# Patient Record
Sex: Female | Born: 1965
Health system: Southern US, Community
[De-identification: ages and names within clinical notes are randomized; demographics above are authoritative.]

## PROBLEM LIST (undated history)

## (undated) DIAGNOSIS — M5030 Other cervical disc degeneration, unspecified cervical region: Secondary | ICD-10-CM

## (undated) DIAGNOSIS — E785 Hyperlipidemia, unspecified: Secondary | ICD-10-CM

## (undated) DIAGNOSIS — I059 Rheumatic mitral valve disease, unspecified: Secondary | ICD-10-CM

## (undated) DIAGNOSIS — I493 Ventricular premature depolarization: Secondary | ICD-10-CM

## (undated) DIAGNOSIS — N200 Calculus of kidney: Secondary | ICD-10-CM

## (undated) DIAGNOSIS — J189 Pneumonia, unspecified organism: Secondary | ICD-10-CM

## (undated) DIAGNOSIS — K8020 Calculus of gallbladder without cholecystitis without obstruction: Secondary | ICD-10-CM

## (undated) DIAGNOSIS — Z87442 Personal history of urinary calculi: Secondary | ICD-10-CM

## (undated) HISTORY — PX: TONSILLECTOMY: SHX5217

## (undated) HISTORY — PX: TONSILLECTOMY: SUR1361

## (undated) HISTORY — PX: CHOLECYSTECTOMY: SHX55

## (undated) HISTORY — DX: Hyperlipidemia, unspecified: E78.5

## (undated) HISTORY — DX: Rheumatic mitral valve disease, unspecified: I05.9

## (undated) HISTORY — DX: Calculus of gallbladder without cholecystitis without obstruction: K80.20

## (undated) HISTORY — DX: Personal history of urinary calculi: Z87.442

## (undated) HISTORY — DX: Pneumonia, unspecified organism: J18.9

## (undated) HISTORY — PX: ABDOMINAL HYSTERECTOMY: SHX81

## (undated) HISTORY — DX: Other cervical disc degeneration, unspecified cervical region: M50.30

## (undated) HISTORY — DX: Calculus of kidney: N20.0

---

## 2008-08-24 ENCOUNTER — Ambulatory Visit: Payer: Self-pay | Admitting: Internal Medicine

## 2008-08-24 DIAGNOSIS — Z87442 Personal history of urinary calculi: Secondary | ICD-10-CM | POA: Insufficient documentation

## 2008-08-24 DIAGNOSIS — M5030 Other cervical disc degeneration, unspecified cervical region: Secondary | ICD-10-CM | POA: Insufficient documentation

## 2008-08-24 DIAGNOSIS — L570 Actinic keratosis: Secondary | ICD-10-CM | POA: Insufficient documentation

## 2008-08-24 DIAGNOSIS — I059 Rheumatic mitral valve disease, unspecified: Secondary | ICD-10-CM | POA: Insufficient documentation

## 2008-08-25 LAB — CONVERTED CEMR LAB
Cholesterol: 227 mg/dL
Direct LDL: 141.4 mg/dL
Glucose, Bld: 93 mg/dL
HDL: 71.5 mg/dL
Total CHOL/HDL Ratio: 3.2
Triglycerides: 49 mg/dL
VLDL: 10 mg/dL

## 2008-10-28 ENCOUNTER — Emergency Department (HOSPITAL_COMMUNITY): Admission: EM | Admit: 2008-10-28 | Discharge: 2008-10-28 | Payer: Self-pay | Admitting: Emergency Medicine

## 2009-10-04 ENCOUNTER — Encounter: Payer: Self-pay | Admitting: Internal Medicine

## 2009-10-30 ENCOUNTER — Encounter: Payer: Self-pay | Admitting: Internal Medicine

## 2009-10-31 ENCOUNTER — Encounter: Payer: Self-pay | Admitting: Internal Medicine

## 2009-11-01 DIAGNOSIS — R928 Other abnormal and inconclusive findings on diagnostic imaging of breast: Secondary | ICD-10-CM | POA: Insufficient documentation

## 2009-11-06 ENCOUNTER — Encounter (INDEPENDENT_AMBULATORY_CARE_PROVIDER_SITE_OTHER): Payer: Self-pay | Admitting: *Deleted

## 2009-12-07 ENCOUNTER — Ambulatory Visit: Payer: Self-pay | Admitting: Family Medicine

## 2009-12-08 ENCOUNTER — Encounter: Payer: Self-pay | Admitting: Family Medicine

## 2010-01-24 ENCOUNTER — Telehealth: Payer: Self-pay | Admitting: Internal Medicine

## 2010-03-14 ENCOUNTER — Telehealth: Payer: Self-pay | Admitting: Internal Medicine

## 2010-05-07 ENCOUNTER — Ambulatory Visit: Payer: Self-pay | Admitting: Internal Medicine

## 2010-05-07 DIAGNOSIS — E785 Hyperlipidemia, unspecified: Secondary | ICD-10-CM | POA: Insufficient documentation

## 2010-05-09 LAB — CONVERTED CEMR LAB: Glucose, Bld: 73 mg/dL (ref 70–99)

## 2010-05-25 ENCOUNTER — Ambulatory Visit: Payer: Self-pay | Admitting: Internal Medicine

## 2010-05-25 DIAGNOSIS — N390 Urinary tract infection, site not specified: Secondary | ICD-10-CM | POA: Insufficient documentation

## 2010-05-25 LAB — CONVERTED CEMR LAB
Nitrite: POSITIVE
Specific Gravity, Urine: 1.015
Urobilinogen, UA: 0.2

## 2010-05-26 ENCOUNTER — Encounter: Payer: Self-pay | Admitting: Family Medicine

## 2010-08-14 ENCOUNTER — Encounter: Payer: Self-pay | Admitting: Internal Medicine

## 2010-08-15 ENCOUNTER — Encounter: Payer: Self-pay | Admitting: Internal Medicine

## 2010-08-27 LAB — HM MAMMOGRAPHY: HM Mammogram: NORMAL

## 2010-08-28 ENCOUNTER — Encounter: Payer: Self-pay | Admitting: Internal Medicine

## 2010-08-28 ENCOUNTER — Ambulatory Visit
Admission: RE | Admit: 2010-08-28 | Discharge: 2010-08-28 | Payer: Self-pay | Source: Home / Self Care | Attending: Family Medicine | Admitting: Family Medicine

## 2010-08-28 LAB — CONVERTED CEMR LAB
Casts: 0 /lpf
Glucose, Urine, Semiquant: NEGATIVE
Nitrite: POSITIVE
Yeast, UA: 0

## 2010-08-29 ENCOUNTER — Encounter: Payer: Self-pay | Admitting: Family Medicine

## 2010-09-04 NOTE — Progress Notes (Signed)
Summary: refill request for estradiol  Phone Note Refill Request Message from:  Fax from Pharmacy  Refills Requested: Medication #1:  ESTRADIOL 0.025 MG/24HR PTWK apply one patch every week   Last Refilled: 06/27/2009 Faxed request from [employer] is on your desk.  Pt has only been seen here once in the last year, and that was for UTI.  Initial call taken by: Lowella Petties CMA,  January 24, 2010 11:15 AM  Follow-up for Phone Call        okay to fill 1 month with 3 refills have her set up a physical in the next few months Follow-up by: Cindee Salt MD,  January 24, 2010 1:44 PM  Additional Follow-up for Phone Call Additional follow up Details #1::        Rx faxed to pharmacy, Spoke with patient and advised results.  Additional Follow-up by: Mervin Hack CMA Duncan Dull),  January 24, 2010 2:42 PM    Prescriptions: ESTRADIOL 0.025 MG/24HR PTWK (ESTRADIOL) apply one patch every week  #12 Transderm x 0   Entered by:   Mervin Hack CMA (AAMA)   Authorized by:   Cindee Salt MD   Signed by:   Mervin Hack CMA (AAMA) on 01/24/2010   Method used:   [facility]        [id number]       [address]       [place], [place]  [address]       Ph: [phone]       Fax: [phone]   RxID:   [id number]

## 2010-09-04 NOTE — Assessment & Plan Note (Signed)
Summary: 9:15 ?UTI/CLE   Vital Signs:  Patient profile:   45 year old female Height:      64 inches Weight:      149.8 pounds BMI:     25.81 Temp:     98.2 degrees F oral Pulse rate:   76 / minute Pulse rhythm:   regular BP sitting:   120 / 70  (left arm) Cuff size:   regular  Vitals Entered By: Benny Lennert CMA Duncan Dull) (Dec 07, 2009 9:20 AM)  History of Present Illness: Chief complaint ? UTI  45 year old female with a history of UTI and pyelo presents with R flank pain.   Urine feels "like battery acid." No fever, chills, sweats. Has tried AZO and a lot of fluid No nausea no abd pain  ROS: resolving upper respiratory symptoms, no cough. no diarrhea  Allergies (verified): No Known Drug Allergies  Past History:  Past medical, surgical, family and social histories (including risk factors) reviewed, and no changes noted (except as noted below).  Past Medical History: Reviewed history from 08/24/2008 and no changes required. Cervial disk disease Mitral valve prolapse Nephrolithiasis, hx of  Past Surgical History: Reviewed history from 08/24/2008 and no changes required. Hysterectomy/oophorectomy Laparascopy for adhesions--other ovary removed Cholecystectomy Tonsillectomy  Family History: Reviewed history from 08/24/2008 and no changes required. Mom had breast cancer Dad had bipolar with psychosis. Died @71  1 brother CAD on Dad's side (paternal uncles) No DM ??HTN Mat GM had colon cancer  Social History: Reviewed history from 08/24/2008 and no changes required. 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 Neck supple. No masses, No LAD. Ears and Nose: No external deformity. ABD: S, NT, ND, +BS. No rebound tenderness. No HSM. + R CVAT. none on left. EXTR: No c/c/e NEURO:  Normal gait.  PSYCH: Normally interactive. Conversant. Not depressed or anxious appearing.  Calm demeanor.     Impression & Recommendations:  Problem # 1:  PYELONEPHRITIS (ICD-590.80) Pyelo - check cx  Her updated medication list for this problem includes:    Ciprofloxacin Hcl 500 Mg Tabs (Ciprofloxacin hcl) .Marland Kitchen... 1 by mouth two times a day  Orders: Venipuncture (16109) T-Culture, Urine ([address]11) UA Dipstick w/o Micro (manual) (91478)  Encouraged to push clear liquids, get enough rest, and take acetaminophen as needed. To be seen in 10 days if no improvement, sooner if worse.  Complete Medication List: 1)  Estradiol 0.025 Mg/24hr Ptwk (Estradiol) .... Apply one patch every week 2)  Tramadol Hcl 50 Mg Tabs (Tramadol hcl) .... Take 1 tablet by mouth once daily 3)  Ciprofloxacin Hcl 500 Mg Tabs (Ciprofloxacin hcl) .Marland Kitchen.. 1 by mouth two times a day Prescriptions: CIPROFLOXACIN HCL 500 MG TABS (CIPROFLOXACIN HCL) 1 by mouth two times a day  #28 x 0   Entered and Authorized by:   Hannah Beat MD   Signed by:   Hannah Beat MD on 12/07/2009   Method used:   Electronically to        [id number]       [address]       [place], [place]  [address]       Ph: [phone]       Fax: [phone]   RxID:   [id number]   Current Allergies (reviewed today): No known allergies   Appended  Document: 9:15 ?UTI/CLE    Clinical Lists Changes  Observations: Added new observation of PH URINE: 8.5  (12/07/2009 9:45) Added new observation of SPEC GR URIN: 1.010  (12/07/2009 9:45) Added new observation of APPEARANCE U: Cloudy  (12/07/2009 9:45) Added new observation of UA COLOR: yellow  (12/07/2009 9:45) Added new observation of WBC DIPSTK U: trace  (12/07/2009 9:45) Added new observation of NITRITE URN: positive  (12/07/2009 9:45) Added new observation of UROBILINOGEN: 0.2  (12/07/2009 9:45) Added new observation of PROTEIN, URN: trace  (12/07/2009  9:45) Added new observation of BLOOD UR DIP: negative  (12/07/2009 9:45) Added new observation of KETONES URN: negative  (12/07/2009 9:45) Added new observation of BILIRUBIN UR: negative  (12/07/2009 9:45) Added new observation of GLUCOSE, URN: negative  (12/07/2009 9:45)      Laboratory Results   Urine Tests  Date/Time Received: Dec 07, 2009 9:45 AM  Date/Time Reported: Dec 07, 2009 9:45 AM   Routine Urinalysis   Color: yellow Appearance: Cloudy Glucose: negative   (Normal Range: Negative) Bilirubin: negative   (Normal Range: Negative) Ketone: negative   (Normal Range: Negative) Spec. Gravity: 1.010   (Normal Range: 1.003-1.035) Blood: negative   (Normal Range: Negative) pH: 8.5   (Normal Range: 5.0-8.0) Protein: trace   (Normal Range: Negative) Urobilinogen: 0.2   (Normal Range: 0-1) Nitrite: positive   (Normal Range: Negative) Leukocyte Esterace: trace   (Normal Range: Negative)

## 2010-09-04 NOTE — Assessment & Plan Note (Signed)
Summary: ?UTI/CLE   Vital Signs:  Patient profile:   45 year old female Menstrual status:  hysterectomy Weight:      155.50 pounds Temp:     98.5 degrees F oral Pulse rate:   82 / minute Pulse rhythm:   regular BP sitting:   124 / 72  (left arm) Cuff size:   regular  Vitals Entered By: Selena Batten Dance CMA Duncan Dull) (May 25, 2010 3:59 PM) CC: ? UTI   History of Present Illness: CC: UTI ?  3d ago with R flank pain, throbbing, smells WBCs in urine.  drinking cranberry juice.  + dysuria.  + polyuria.  No urgency.  No fevers/chills.  voiding battery acid urine.  h/o scarring in R kidney, seen uro in past.  6wks ago had UTI at [facility], treated with 10d course of Cipro, came back. 21mo ago seen [facility] with dx pyelo treated with 14d course cipro.  Current Medications (verified): 1)  Estradiol 0.025 Mg/24hr Ptwk (Estradiol) .... Apply One Patch Every Week 2)  Tramadol Hcl 50 Mg Tabs (Tramadol Hcl) .... Take 1 Tablet By Mouth Once Daily 3)  Sumatriptan Succinate 50 Mg Tabs (Sumatriptan Succinate) .Marland Kitchen.. 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   Lungs:  normal respiratory effort, no intercostal retractions, no accessory muscle use, and normal breath sounds.   Heart:  normal rate, regular rhythm, no murmur, and no gallop.   Abdomen:  soft, non-tender, and no masses.  R CVA tenderness. Pulses:  2+ rad pulses Extremities:  no edema Skin:  no rashes and no suspicious lesions.     Impression & Recommendations:  Problem #  1:  UTI (ICD-599.0)  with flank pain.  concern for pyelo.  treat with cipro as has worked in past for patient.  per patient only 14 days work.  Ucx sent.  Her updated medication list for this problem includes:    Cipro 500 Mg Tabs (Ciprofloxacin hcl) .Marland Kitchen... Take one by mouth two times a day x 14 days  Orders: UA Dipstick W/ Micro (manual) (16109) Specimen Handling (99000) T-Culture, Urine ([id number])  Complete Medication List: 1)  Estradiol 0.025 Mg/24hr Ptwk (Estradiol) .... Apply one patch every week 2)  Tramadol Hcl 50 Mg Tabs (Tramadol hcl) .... Take 1 tablet by mouth once daily 3)  Sumatriptan Succinate 50 Mg Tabs (Sumatriptan succinate) .Marland Kitchen.. 1-2 tabs at onset of migraine 4)  Cipro 500 Mg Tabs (Ciprofloxacin hcl) .... Take one by mouth two times a day x 14 days  Patient Instructions: 1)  UTI - take cipro twice daily for 14 days. 2)  Return if not improving as expected. 3)  Remember water is good . cranberry juice is good. 4)  Good to see you today, call clinic with questions. Prescriptions: CIPRO 500 MG TABS (CIPROFLOXACIN HCL) take one by mouth two times a day x 14 days  #28 x 0   Entered and Authorized by:   Eustaquio Boyden  MD   Signed by:   Eustaquio Boyden  MD on 05/25/2010   Method used:   [facility]  [address] [id number]       [address]       [place], [place]  [address]       Ph: [phone]       Fax: [phone]   RxID:   [id number]    Orders Added: 1)  Est. Patient Level III [24401] 2)  UA Dipstick W/ Micro (manual) [81000] 3)  Specimen Handling [99000] 4)  T-Culture, Urine [[id number]]     Current Allergies (reviewed today): CODEINE  Laboratory Results   Urine Tests  Date/Time Received: May 25, 2010 4:00 PM  Date/Time Reported: May 25, 2010 4:00 PM   Routine Urinalysis   Color: yellow Appearance: Cloudy Glucose: negative   (Normal Range: Negative) Bilirubin: negative   (Normal Range:  Negative) Ketone: negative   (Normal Range: Negative) Spec. Gravity: 1.015   (Normal Range: 1.003-1.035) Blood: trace-lysed   (Normal Range: Negative) pH: 6.0   (Normal Range: 5.0-8.0) Protein: negative   (Normal Range: Negative) Urobilinogen: 0.2   (Normal Range: 0-1) Nitrite: positive   (Normal Range: Negative) Leukocyte Esterace: large   (Normal Range: Negative)  Urine Microscopic WBC/HPF: 10-20 RBC/HPF: rare Bacteria/HPF: 1+ rods Mucous/HPF: no Epithelial/HPF: 1-5 Crystals/HPF: no Casts/LPF: no Yeast/HPF: no Other: no    Comments: read by .........................Eustaquio Boyden  MD  May 25, 2010 4:21 PM  UCx sent.

## 2010-09-04 NOTE — Assessment & Plan Note (Signed)
Summary: CPX/CLE   Vital Signs:  Patient profile:   45 year old female Menstrual status:  hysterectomy Weight:      154 pounds Temp:     98.5 degrees F oral Pulse rate:   88 / minute Pulse rhythm:   regular BP sitting:   110 / 68  (left arm) Cuff size:   regular  Vitals Entered By: Mervin Hack CMA Duncan Dull) (May 07, 2010 3:12 PM) CC: adult physical     Menstrual Status hysterectomy   History of Present Illness: Did have 6 month follow up mammo now due for regular yearly exam No problems on self exam  Did lose some weight with fitness efforts Now put a few pounds back on  Has noted some decline in her attention span did use adderall for about 3 years but stopped 6-7 years ago No emotional issues and mood has been okay Has tended to have more focus when she is stressed No sig problems with work performance  still on her patch since the surgical menopause Did try off the patch for about 1 month when called back in for the mammo in March Did have loss of eyebrows without the hormone    Allergies: No Known Drug Allergies  Past History:  Past medical, surgical, family and social histories (including risk factors) reviewed for relevance to current acute and chronic problems.  Past Medical History: Cervial disk disease Mitral valve prolapse Nephrolithiasis, hx of Hyperlipidemia  Past Surgical History: Reviewed history from 08/24/2008 and no changes required. Hysterectomy/oophorectomy Laparascopy for adhesions--other ovary removed Cholecystectomy Tonsillectomy  Family History: Reviewed history from 08/24/2008 and no changes required. Mom had breast cancer Dad had bipolar with psychosis. Died @71  1 brother CAD on Dad's side (paternal uncles) No DM ??HTN Mat GM had colon cancer  Social History: Reviewed history from 08/24/2008 and no changes required. 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 Eyes:  Denies double vision and vision loss-1 eye. ENT:  Denies decreased hearing and ringing in ears; teeth okay--regular with dentist. CV:  Denies chest pain or discomfort, difficulty breathing at night, difficulty breathing while lying down, fainting, lightheadness, palpitations, and shortness of breath with exertion. Resp:  Denies cough and shortness of breath. GI:  Denies abdominal pain, bloody stools, constipation, dark tarry stools, indigestion, nausea, and vomiting. GU:  Denies dysuria and incontinence; no sexual problems. MS:  Denies joint pain and joint swelling. Derm:  Denies lesion(s) and rash. Neuro:  Complains of headaches; denies numbness, tingling, and weakness; migraines developed after the hysterectomy and BSO noted headaches again starting 6 months ago Has had 3-4 severe migraines since then tried various OTC meds. Psych:  Denies anxiety and depression. Heme:  Denies abnormal bruising and enlarge lymph nodes. Allergy:  Complains of seasonal allergies and sneezing; mild symptoms --OTC meds are fine.  Physical Exam  General:  alert and normal appearance.   Eyes:  pupils round, pupils reactive to light, and no optic disk abnormalities.  Mild anisocoria--left>right Ears:  R ear normal and L ear normal.   Mouth:  no erythema, no exudates, and no lesions.   Neck:  supple, no masses, no thyromegaly, no carotid bruits, and no cervical lymphadenopathy.   Breasts:  no masses, no abnormal thickening, no tenderness, and no adenopathy.   Lungs:  normal respiratory effort, no intercostal retractions, no accessory muscle use, and normal breath sounds.   Heart:  normal rate, regular rhythm, no murmur, and no gallop.   Abdomen:  soft, non-tender,  and no masses.   Msk:  no joint tenderness and no joint swelling.   Pulses:  1+ in feet Extremities:  no edema Neurologic:  alert & oriented X3, strength normal in all extremities, and gait normal.   Skin:  no rashes and no suspicious lesions.   Psych:  normally interactive, good eye contact, not anxious appearing, and not depressed appearing.     Impression & Recommendations:  Problem # 1:  PREVENTIVE HEALTH CARE (ICD-V70.0) Assessment Comment Only  healthy got in better shape but has slacked off counselled about exercising again and avoiding junk food gets flu shot at work will check glucose  Orders: TLB-Glucose, QUANT (82947-GLU) Venipuncture ([address]109)  Problem # 2:  MITRAL VALVE PROLAPSE (ICD-424.0) Assessment: Unchanged no symptoms  Complete Medication List: 1)  Estradiol 0.025 Mg/24hr Ptwk (Estradiol) .... Apply one patch every week 2)  Tramadol Hcl 50 Mg Tabs (Tramadol hcl) .... Take 1 tablet by mouth once daily 3)  Sumatriptan Succinate 50 Mg Tabs (Sumatriptan succinate) .Marland Kitchen.. 1-2 tabs at onset of migraine  Patient Instructions: 1)  Please schedule a follow-up appointment in 1 year.  Prescriptions: SUMATRIPTAN SUCCINATE 50 MG TABS (SUMATRIPTAN SUCCINATE) 1-2 tabs at onset of migraine  #10 x 1   Entered and Authorized by:   Cindee Salt MD   Signed by:   Cindee Salt MD on 05/07/2010   Method used:   [facility] to        [id number]       [address]       [place], [place]  [address]       Ph: [phone]       Fax: [phone]   RxID:   [id number] ESTRADIOL 0.025 MG/24HR PTWK (ESTRADIOL) apply one patch every week  #12 Transderm x 3   Entered by:   Mervin Hack CMA (AAMA)   Authorized by:   Cindee Salt MD   Signed by:   Mervin Hack CMA (AAMA) on 05/07/2010   Method used:   Electronically to        [id number]       [address]       [place], [place]  [address]        Ph: [phone]       Fax: [phone]   RxID:   [id number]   Current Allergies (reviewed today): No known allergies

## 2010-09-04 NOTE — Letter (Signed)
Summary: Primary Care Consult Scheduled Letter  [employer] [facility]  [address] [place], [place] [address]   Phone: [phone]  Fax: [phone]      11/06/2009 MRN: [id number]  Sherri Rice [address] [place], [place]  [address]    Dear Sherri Rice,      We have scheduled an appointment for you.  At the recommendation of Dr.__Richard Alphonsus Sias, we have scheduled your 6 month follow up Mammogram  on ___October 3,2011 at _10:30am . [place], [place], [place]. The office phone number is ____________.  If this appointment day and time is not convenient for you, please feel free to call the office of the doctor you are being referred to at the number listed above and reschedule the appointment.     It is important for you to keep your scheduled appointments. We are here to make sure you are given good patient care. If you have questions or you have made changes to your appointment, please notify us at  [phone]       , ask for [facility].    Thank you,  Patient Care Coordinator [place] at [facility]

## 2010-09-04 NOTE — Progress Notes (Signed)
Summary: Rx Tramadol  Phone Note Refill Request Call back at [phone] [facility] on March 14, 2010 9:20 AM  Refills Requested: Medication #1:  TRAMADOL HCL 50 MG TABS take 1 tablet by mouth once daily  Method Requested: Telephone to Pharmacy Initial call taken by: Sydell Axon LPN,  March 14, 2010 9:21 AM  Follow-up for Phone Call        okay #90 x 0 Follow-up by: Cindee Salt MD,  March 14, 2010 2:04 PM  Additional Follow-up for Phone Call Additional follow up Details #1::        Rx called to pharmacy Additional Follow-up by: Linde Gillis CMA Duncan Dull),  March 14, 2010 2:15 PM    Prescriptions: TRAMADOL HCL 50 MG TABS (TRAMADOL HCL) take 1 tablet by mouth once daily  #90 Tablet x 0   Entered by:   Linde Gillis CMA (AAMA)   Authorized by:   Cindee Salt MD   Signed by:   Linde Gillis CMA (AAMA) on 03/14/2010   Method used:   Telephoned to ...       [id number]       [address]       [place], [place]  [address]       Ph: [phone]       Fax: [phone]   RxID:   [id number]

## 2010-09-06 ENCOUNTER — Telehealth: Payer: Self-pay | Admitting: Family Medicine

## 2010-09-06 NOTE — Assessment & Plan Note (Signed)
Summary: uti/alc   Vital Signs:  Patient profile:   45 year old female Menstrual status:  hysterectomy Height:      64 inches Weight:      155.25 pounds BMI:     26.74 Temp:     98.1 degrees F oral Pulse rate:   76 / minute Pulse rhythm:   regular BP sitting:   110 / 78  (left arm) Cuff size:   regular  Vitals Entered By: Lewanda Rife LPN (August 28, 2010 8:43 AM) CC: ?UTI burning and pain upon urination, frequency of urine, low back pain on rt, hx of kidney stones. pain level now 6   History of Present Illness: thinks she has a uti  also hx of kidney stones   is having R flank pain and low grade fever on and off  does get utis frequent  burning urination  using otc urinary analgesics - [facility] - this helps a bit  this is 2nd uti in past 6 months  no n/v   has not seen urologist in quite a while-- 8 years   Allergies: 1)  Codeine  Past History:  Past Medical History: Last updated: 05/07/2010 Cervial disk disease Mitral valve prolapse Nephrolithiasis, hx of Hyperlipidemia  Past Surgical History: Last updated: 08/24/2008 Hysterectomy/oophorectomy Laparascopy for adhesions--other ovary removed Cholecystectomy Tonsillectomy  Family History: Last updated: 08/24/2008 Mom had breast cancer Dad had bipolar with psychosis. Died @71  1 brother CAD on Dad's side (paternal uncles) No DM ??HTN Mat GM had colon cancer  Social History: Last updated: 08/24/2008 Occupation: Radiology PA for [employer] Married--2 children Former Smoker--quit in the 1980's Alcohol use-yes  Risk Factors: Smoking Status: quit (08/24/2008)  Review of Systems General:  Complains of fatigue; denies chills and fever. Eyes:  Denies blurring and eye irritation. CV:  Denies chest pain or discomfort and palpitations. Resp:  Denies cough. GI:  Denies abdominal pain, change in bowel habits, and nausea. GU:  Complains of dysuria and urinary frequency; denies hematuria. MS:   Complains of low back pain. Derm:  Denies rash. Heme:  Denies abnormal bruising and bleeding.  Physical Exam  General:  Well-developed,well-nourished,in no acute distress; alert,appropriate and cooperative throughout examination Head:  normocephalic, atraumatic, and no abnormalities observed.   Eyes:  vision grossly intact, pupils equal, pupils round, and pupils reactive to light.   Neck:  supple, no masses, no thyromegaly, no carotid bruits, and no cervical lymphadenopathy.   Lungs:  normal respiratory effort, no intercostal retractions, no accessory muscle use, and normal breath sounds.   Heart:  normal rate, regular rhythm, no murmur, and no gallop.   Abdomen:  mild suprapubic tenderness- no rebound or gaurding Msk:  mild R cva tenderness Skin:  Intact without suspicious lesions or rashes Cervical Nodes:  No lymphadenopathy noted Inguinal Nodes:  No significant adenopathy Psych:  normal affect, talkative and pleasant    Impression & Recommendations:  Problem # 1:  UTI (ICD-599.0) Assessment New complicated by flank pain  hx of pyelo in past and also kidney stone tx with high dose cipro cx urine  red flags disc  if pain continues consider stone w/u The following medications were removed from the medication list:    Cipro 500 Mg Tabs (Ciprofloxacin hcl) .Marland Kitchen... Take one by mouth two times a day x 14 days Her updated medication list for this problem includes:    Pyridium 100 Mg Tabs (Phenazopyridine hcl) ..... Otc as directed.    Cipro 500 Mg Tabs (Ciprofloxacin hcl) .Marland KitchenMarland KitchenMarland KitchenMarland Kitchen  1 by mouth two times a day for 7 days  Orders: T-Culture, Urine ([id number]) Specimen Handling ([id number]) Prescription Created Electronically [phone]) UA Dipstick W/ Micro (manual) (81000)  Complete Medication List: 1)  Estradiol 0.025 Mg/24hr Ptwk (Estradiol) .... Apply one patch every week 2)  Tramadol Hcl 50 Mg Tabs (Tramadol hcl) .... Take 1 tablet by mouth once daily as needed 3)  Sumatriptan Succinate  50 Mg Tabs (Sumatriptan succinate) .Marland Kitchen.. 1-2 tabs at onset of migraine as needed 4)  Pyridium 100 Mg Tabs (Phenazopyridine hcl) .... Otc as directed. 5)  Tylenol Extra Strength 500 Mg Tabs (Acetaminophen) .... Otc as directed. 6)  Cipro 500 Mg Tabs (Ciprofloxacin hcl) .Marland Kitchen.. 1 by mouth two times a day for 7 days  Patient Instructions: 1)  continue drinking lots of water 2)  call or seek care is symptoms don't improve in 2-3 days or if you develop worse back pain, nausea, or vomiting  3)  take the cipro as directed  4)  will update you with culture report when it comes back  Prescriptions: CIPRO 500 MG TABS (CIPROFLOXACIN HCL) 1 by mouth two times a day for 7 days  #14 x 0   Entered and Authorized by:   Judith Part MD   Signed by:   Judith Part MD on 08/28/2010   Method used:   [facility] to        [id number]       [address]       [place], [place]  [address]       Ph: [phone]       Fax: [phone]   RxID:   [id number]    Orders Added: 1)  T-Culture, Urine [[id number]] 2)  Specimen Handling [99000] 3)  Prescription Created Electronically [[id number]] 4)  UA Dipstick W/ Micro (manual) [81000]    Current Allergies (reviewed today): CODEINE  Laboratory Results   Urine Tests  Date/Time Received: August 28, 2010 8:46 AM  Date/Time Reported: August 28, 2010 8:46 AM   Routine Urinalysis   Color: yellow Appearance: Cloudy Glucose: negative   (Normal Range: Negative) Bilirubin: negative   (Normal Range: Negative) Ketone: negative   (Normal Range: Negative) Spec. Gravity: 1.010   (Normal Range: 1.003-1.035) Blood: trace-intact   (Normal Range: Negative) pH: 6.5   (Normal Range: 5.0-8.0) Protein: trace   (Normal Range: Negative) Urobilinogen: 0.2   (Normal Range: 0-1) Nitrite: positive   (Normal Range: Negative) Leukocyte Esterace: trace   (Normal Range: Negative)  Urine Microscopic WBC/HPF: 3-4 RBC/HPF: 0-2 Bacteria/HPF:  mod Mucous/HPF: few Epithelial/HPF: 1-2 Crystals/HPF: 0 Casts/LPF: 0 Yeast/HPF: 0 Other: 0

## 2010-09-06 NOTE — Letter (Signed)
Summary: Results Follow up Letter  [place] [facility]  [address] [place], [place] [address]   Phone: [phone]  Fax: [phone]    08/28/2010 MRN: [id number]  Sherri Rice [address] [place], [place]  [address]  Dear [name],  The following are the results of your recent test(s):  Test         Result    Pap Smear:        Normal _____  Not Normal _____ Comments: ______________________________________________________ Cholesterol: LDL(Bad cholesterol):         Your goal is less than:         HDL (Good cholesterol):       Your goal is more than: Comments:  ______________________________________________________ Mammogram:        Normal __X___  Not Normal _____ Comments:Mammo was fine. Repeat recommended in 1-2 years   ___________________________________________________________________ Hemoccult:        Normal _____  Not normal _______ Comments:    _____________________________________________________________________ Other Tests:    We routinely do not discuss normal results over the telephone.  If you desire a copy of the results, or you have any questions about this information we can discuss them at your next office visit.   Sincerely,      Tillman Abide, MD

## 2010-09-12 NOTE — Progress Notes (Signed)
Summary: still having symptoms of uti  Phone Note Call from Patient Call back at Work Phone [phone]   Caller: Patient Call For: Dr. Milinda Antis Summary of Call: Patient has now finished her cipro for the uti and she says that she is still having symptoms. She says that in the past she has taken it for 10 days instead of 7. She is asking if she could get anothe rx [facility] [address]  Initial call taken by: Melody Comas,  September 06, 2010 8:35 AM  Follow-up for Phone Call        her cx shows bacteria is sensitive to it  can go ahead and ext course for 5 d at that time if not better - f/u please  if worse at any time alert me please  Follow-up by: Judith Part MD,  September 06, 2010 9:11 AM    New/Updated Medications: CIPRO 500 MG TABS (CIPROFLOXACIN HCL) 1 by mouth two times a day for 4 days Prescriptions: CIPRO 500 MG TABS (CIPROFLOXACIN HCL) 1 by mouth two times a day for 4 days  #8 x 0   Entered by:   Melody Comas   Authorized by:   Judith Part MD   Signed by:   Melody Comas on 09/06/2010   Method used:   Electronically to        [id number]       [address]       [place], [place]  [address]       Ph: [phone]       Fax: [phone]   RxID:   [id number]

## 2010-10-02 ENCOUNTER — Telehealth: Payer: Self-pay | Admitting: Internal Medicine

## 2010-10-11 NOTE — Progress Notes (Signed)
Summary: SUMATRIPTAN SUCCINATE  Phone Note Refill Request Message [id number] on October 02, 2010 3:59 PM  Refills Requested: Medication #1:  SUMATRIPTAN SUCCINATE 50 MG TABS 1-2 tabs at onset of migraine as needed   Last Refilled: 10/02/2010 E-Scribe Request    Method Requested: Telephone to Pharmacy Initial call taken by: Mervin Hack CMA Duncan Dull),  October 02, 2010 3:59 PM  Follow-up for Phone Call        Okay #9 x 2  see what insurance will cover for same copay Cindee Salt MD  October 03, 2010 7:42 AM   Additional Follow-up for Phone Call Additional follow up Details #1::        Rx faxed to pharmacy Additional Follow-up by: DeShannon Smith CMA Duncan Dull),  October 03, 2010 8:06 AM    Prescriptions: SUMATRIPTAN SUCCINATE 50 MG TABS (SUMATRIPTAN SUCCINATE) 1-2 tabs at onset of migraine as needed  #9 x 2   Entered by:   Mervin Hack CMA (AAMA)   Authorized by:   Cindee Salt MD   Signed by:   Mervin Hack CMA (AAMA) on 10/03/2010   Method used:   Electronically to        [id number]       [address]       [place], [place]  [address]       Ph: [phone]       Fax: [phone]   RxID:   [id number]

## 2010-10-30 ENCOUNTER — Other Ambulatory Visit: Payer: Self-pay | Admitting: Internal Medicine

## 2010-11-15 LAB — BASIC METABOLIC PANEL
BUN: 8 mg/dL (ref 6–23)
Calcium: 9.2 mg/dL (ref 8.4–10.5)
Chloride: 102 mEq/L (ref 96–112)
Creatinine, Ser: 0.79 mg/dL (ref 0.4–1.2)
GFR calc Af Amer: >60 mL/min (ref >60)
GFR calc non Af Amer: >60 mL/min (ref >60)

## 2010-11-15 LAB — CBC
Platelets: 290 10*3/uL (ref 150–400)
RBC: 4.72 MIL/uL (ref 3.87–5.11)
WBC: 9.3 10*3/uL (ref 4.0–10.5)

## 2010-11-15 LAB — D-DIMER, QUANTITATIVE: D-Dimer, Quant: 0.22 ug/mL-FEU (ref 0.00–0.48)

## 2010-11-15 LAB — DIFFERENTIAL
Eosinophils Absolute: 0.3 10*3/uL (ref 0.0–0.7)
Lymphocytes Relative: 21 % (ref 12–46)
Lymphs Abs: 2 10*3/uL (ref 0.7–4.0)
Monocytes Relative: 7 % (ref 3–12)
Neutro Abs: 6.2 10*3/uL (ref 1.7–7.7)
Neutrophils Relative %: 67 % (ref 43–77)

## 2011-01-14 ENCOUNTER — Other Ambulatory Visit: Payer: Self-pay | Admitting: *Deleted

## 2011-01-14 MED ORDER — TRAMADOL HCL 50 MG PO TABS: ORAL_TABLET | ORAL | Status: DC

## 2011-01-14 NOTE — Telephone Encounter (Signed)
rx sent to pharmacy by e-script  

## 2011-01-23 ENCOUNTER — Other Ambulatory Visit: Payer: Self-pay | Admitting: *Deleted

## 2011-01-23 MED ORDER — SUMATRIPTAN SUCCINATE 50 MG PO TABS: ORAL_TABLET | ORAL | Status: DC

## 2011-01-23 NOTE — Telephone Encounter (Signed)
Okay to refill #10 x 1  Due for physical by the end of the year. May want to set this up soon

## 2011-01-23 NOTE — Telephone Encounter (Signed)
Rx called to pharmacy

## 2011-04-09 ENCOUNTER — Other Ambulatory Visit: Payer: Self-pay | Admitting: *Deleted

## 2011-04-09 NOTE — Telephone Encounter (Signed)
Patient was last seen by Dr. Alphonsus Sias 05/07/10 for CPX ( in centricity) last seen by [facility] 08/28/10 for UTI and low back pain. Please advise on refill, no answer at numbers in chart and no answering machine.

## 2011-04-09 NOTE — Telephone Encounter (Signed)
Please find out what is going on  I haven't seen her in almost a year and I don't remember discussing pain meds

## 2011-04-09 NOTE — Telephone Encounter (Signed)
Ok to fill 

## 2011-04-09 NOTE — Telephone Encounter (Signed)
Deny the refill and leave message or send letter for appt to discuss what is going on

## 2011-04-10 MED ORDER — TRAMADOL HCL 50 MG PO TABS: ORAL_TABLET | ORAL | Status: DC

## 2011-04-10 NOTE — Telephone Encounter (Signed)
Okay to refill #90 x 0 I will have to make sure I document how she is using this---I assume for her neck issues Will review at upcoming physical  Make sure she knows we are being extra careful with all the diversion and medical issues with prescription pain medications

## 2011-04-10 NOTE — Telephone Encounter (Signed)
rx sent to pharmacy by e-script Spoke with patient and advised results   

## 2011-04-10 NOTE — Telephone Encounter (Signed)
Spoke with patient and she was very upset that she was asked to come in, pt states she is very compliant and don't know what's going on. Per pt when she called for Imitrex refill, someone told her she hadn't been seen in the last 2 years. I looked in centricity and pt was seen in 10/11, 12/11 and 1/12 for office visits. Pt is due for physical next month in October. Please advise on refill.

## 2011-06-13 ENCOUNTER — Other Ambulatory Visit: Payer: Self-pay | Admitting: *Deleted

## 2011-06-13 MED ORDER — TRAMADOL HCL 50 MG PO TABS: ORAL_TABLET | ORAL | Status: DC

## 2011-06-13 NOTE — Telephone Encounter (Signed)
Fax asking for refill, pt was last seen 08/2010.  Last refill was 04/10/11, please advise

## 2011-06-13 NOTE — Telephone Encounter (Signed)
Okay #30 x 0 She needs to set up PE We will have to discuss this and how she is using it at this upcoming visit

## 2011-06-13 NOTE — Telephone Encounter (Signed)
Spoke with patient and advised results rx sent to pharmacy by e-script  

## 2011-07-01 ENCOUNTER — Ambulatory Visit: Payer: Self-pay | Admitting: Internal Medicine

## 2011-07-03 ENCOUNTER — Encounter: Payer: Self-pay | Admitting: Internal Medicine

## 2011-07-05 ENCOUNTER — Ambulatory Visit: Payer: Self-pay | Admitting: Internal Medicine

## 2011-07-05 DIAGNOSIS — Z0289 Encounter for other administrative examinations: Secondary | ICD-10-CM

## 2011-07-17 ENCOUNTER — Telehealth: Payer: Self-pay | Admitting: *Deleted

## 2011-07-17 ENCOUNTER — Encounter: Payer: Self-pay | Admitting: Internal Medicine

## 2011-07-17 ENCOUNTER — Ambulatory Visit (INDEPENDENT_AMBULATORY_CARE_PROVIDER_SITE_OTHER): Payer: BC Managed Care – PPO | Admitting: Internal Medicine

## 2011-07-17 VITALS — BP 140/72 | HR 85 | Temp 97.8°F | Wt 153.0 lb

## 2011-07-17 DIAGNOSIS — R300 Dysuria: Secondary | ICD-10-CM

## 2011-07-17 DIAGNOSIS — N390 Urinary tract infection, site not specified: Secondary | ICD-10-CM

## 2011-07-17 LAB — POCT URINALYSIS DIPSTICK
Bilirubin, UA: NEGATIVE
Glucose, UA: NEGATIVE
Nitrite, UA: POSITIVE
Urobilinogen, UA: 0.2

## 2011-07-17 MED ORDER — CIPROFLOXACIN HCL 250 MG PO TABS: 250.0000 mg | ORAL_TABLET | Freq: Two times a day (BID) | ORAL | Status: AC

## 2011-07-17 NOTE — Telephone Encounter (Signed)
Can add on at 12:15

## 2011-07-17 NOTE — Progress Notes (Signed)
  Subjective:    Patient ID: Sherri Rice, female    DOB: [birth year], 45 y.o.   MRN: [id number]  HPI Having burning dysuria again Started 3-4 days ago Using cranberry and pyridium OTC---may have helped slightly Increased fluids as well Some fever Slight right flank pain No abd pain   Feels she had a UTI within 6 months--levaquin. Might have been seen at an urgent care Last visit here 1/12  Current Outpatient Prescriptions on File Prior to Visit  Medication Sig Dispense Refill  . SUMAtriptan (IMITREX) 50 MG tablet Take one to two tabs on onset of migraine as needed.  10 tablet  1  . traMADol (ULTRAM) 50 MG tablet TAKE 1 TABLET EVERY 4 HOURS AS NEEDED  90 tablet  0    Allergies  Allergen Reactions  . Codeine     REACTION: itching    Past Medical History  Diagnosis Date  . Degeneration of cervical intervertebral disc   . Mitral valve disorders   . Personal history of urinary calculi   . Hyperlipidemia     Past Surgical History  Procedure Date  . Abdominal hysterectomy   . Cholecystectomy   . Tonsillectomy     Family History  Problem Relation Age of Onset  . Breast cancer Mother   . Bipolar disorder Father   . Coronary artery disease Paternal Aunt   . Diabetes Neg Hx   . Cancer Neg Hx     History   Social History  . Marital Status: Married    Spouse Name: N/A    Number of Children: 2  . Years of Education: N/A   Occupational History  . [employer] Radiology    Social History Main Topics  . Smoking status: Former Smoker    Types: Cigarettes    Quit date: 08/05/1978  . Smokeless tobacco: Never Used  . Alcohol Use: Yes  . Drug Use: No  . Sexually Active: Not on file   Other Topics Concern  . Not on file   Social History Narrative  . No narrative on file   Review of Systems GI bug with fever and diarrhea preceded the urinary symptoms by a couple of days No vomiting Appetite is okay    Objective:   Physical Exam  Constitutional: She appears  well-developed and well-nourished. No distress.  Abdominal: Soft. She exhibits no mass. There is no tenderness. There is no rebound.  Musculoskeletal:       Mild right flank pain No left CVA tenderness          Assessment & Plan:

## 2011-07-17 NOTE — Telephone Encounter (Signed)
Patient calling with UTI symptoms, per pt she knows it a UTI, she would like to come in an "pee in a cup". I advised she would need to be seen, per pt if she can't be "squeezed in" she can go to a urgent care and then follow-up with [place]. Please advise, no one is adding on.

## 2011-07-17 NOTE — Telephone Encounter (Signed)
I spoke with patient and she'll come @ 12:15.

## 2011-07-17 NOTE — Assessment & Plan Note (Signed)
Microscopic exam shows 10-20 WBC, occ RBC and 3+ bacteria  Will treat with 2 weeks of cipro as she has had chronic problems in past

## 2011-08-01 ENCOUNTER — Other Ambulatory Visit: Payer: Self-pay | Admitting: Internal Medicine

## 2011-08-01 NOTE — Telephone Encounter (Signed)
Ok to refill? Not on med list in EPIC, on med list in Centricity original rx was 05/07/2010, #12 with 3 rf's

## 2011-08-01 NOTE — Telephone Encounter (Signed)
rx sent to pharmacy by e-script For #12 x 5, these are patches

## 2011-08-01 NOTE — Telephone Encounter (Signed)
Okay to fill #30 x 5

## 2011-08-23 ENCOUNTER — Other Ambulatory Visit: Payer: Self-pay | Admitting: *Deleted

## 2011-08-23 MED ORDER — TRAMADOL HCL 50 MG PO TABS: ORAL_TABLET | ORAL | Status: DC

## 2011-08-23 NOTE — Telephone Encounter (Signed)
Rx done electronically 

## 2011-11-12 ENCOUNTER — Other Ambulatory Visit: Payer: Self-pay

## 2011-11-12 MED ORDER — TRAMADOL HCL 50 MG PO TABS: ORAL_TABLET | ORAL | Status: DC

## 2011-11-12 NOTE — Telephone Encounter (Signed)
rx sent to pharmacy by e-script  

## 2011-11-12 NOTE — Telephone Encounter (Signed)
Okay #90 x 0 

## 2011-11-12 NOTE — Telephone Encounter (Signed)
[facility]ol 50 mg. Pt last seen 07/17/11.

## 2011-12-24 ENCOUNTER — Encounter: Payer: BC Managed Care – PPO | Admitting: Internal Medicine

## 2012-02-03 ENCOUNTER — Other Ambulatory Visit: Payer: Self-pay | Admitting: Internal Medicine

## 2012-02-03 NOTE — Telephone Encounter (Signed)
Okay #90 x 0 

## 2012-02-04 NOTE — Telephone Encounter (Signed)
rx sent to pharmacy by e-script  

## 2012-03-12 ENCOUNTER — Encounter: Payer: BC Managed Care – PPO | Admitting: Internal Medicine

## 2012-03-31 ENCOUNTER — Encounter: Payer: BC Managed Care – PPO | Admitting: Internal Medicine

## 2012-04-07 ENCOUNTER — Ambulatory Visit (INDEPENDENT_AMBULATORY_CARE_PROVIDER_SITE_OTHER): Payer: BC Managed Care – PPO | Admitting: Internal Medicine

## 2012-04-07 ENCOUNTER — Encounter: Payer: Self-pay | Admitting: Internal Medicine

## 2012-04-07 ENCOUNTER — Encounter: Payer: BC Managed Care – PPO | Admitting: Internal Medicine

## 2012-04-07 VITALS — BP 110/70 | HR 52 | Temp 98.6°F | Ht 64.0 in | Wt 151.0 lb

## 2012-04-07 DIAGNOSIS — E785 Hyperlipidemia, unspecified: Secondary | ICD-10-CM

## 2012-04-07 DIAGNOSIS — Z0000 Encounter for general adult medical examination without abnormal findings: Secondary | ICD-10-CM | POA: Insufficient documentation

## 2012-04-07 LAB — BASIC METABOLIC PANEL
BUN: 10 mg/dL (ref 6–23)
CO2: 27 mEq/L (ref 19–32)
Calcium: 9.2 mg/dL (ref 8.4–10.5)
Glucose, Bld: 89 mg/dL (ref 70–99)
Potassium: 4.3 mEq/L (ref 3.5–5.1)
Sodium: 137 mEq/L (ref 135–145)

## 2012-04-07 LAB — CBC WITH DIFFERENTIAL/PLATELET
Basophils Absolute: 0 10*3/uL (ref 0.0–0.1)
Eosinophils Absolute: 0 10*3/uL (ref 0.0–0.7)
HCT: 43.6 % (ref 36.0–46.0)
Hemoglobin: 14.5 g/dL (ref 12.0–15.0)
Lymphs Abs: 1.5 10*3/uL (ref 0.7–4.0)
MCHC: 33.2 g/dL (ref 30.0–36.0)
Neutro Abs: 4.2 10*3/uL (ref 1.4–7.7)
Platelets: 263 10*3/uL (ref 150.0–400.0)
RDW: 12.4 % (ref 11.5–14.6)

## 2012-04-07 LAB — HEPATIC FUNCTION PANEL
Albumin: 3.9 g/dL (ref 3.5–5.2)
Total Protein: 6.8 g/dL (ref 6.0–8.3)

## 2012-04-07 LAB — LDL CHOLESTEROL, DIRECT: Direct LDL: 127.4 mg/dL

## 2012-04-07 LAB — LIPID PANEL: Triglycerides: 71 mg/dL (ref 0.0–149.0)

## 2012-04-07 LAB — TSH: TSH: 1.75 u[IU]/mL (ref 0.35–5.50)

## 2012-04-07 MED ORDER — ESTRADIOL 0.025 MG/24HR TD PTTW: 1.0000 | MEDICATED_PATCH | TRANSDERMAL | Status: DC

## 2012-04-07 NOTE — Progress Notes (Signed)
Subjective:    Patient ID: Sherri Rice, female    DOB: 03-May-1966, 46 y.o.   MRN: [id number]  HPI Here for annual check up Doesn't see gynecologist  Has noticed a problem with the generic estrogen patch Falling off easy and causing rash/swelling Wants to go back to brand due to this  No changes in FH or job  Current Outpatient Prescriptions on File Prior to Visit  Medication Sig Dispense Refill  . SUMAtriptan (IMITREX) 50 MG tablet Take one to two tabs on onset of migraine as needed.  10 tablet  1  . traMADol (ULTRAM) 50 MG tablet TAKE 1 TABLET EVERY 4 HOURS AS NEEDED  90 tablet  0  . DISCONTD: estradiol (CLIMARA - DOSED IN MG/24 HR) 0.025 mg/24hr APPLY ONE PATCH EVERY WEEK  12 patch  5    Allergies  Allergen Reactions  . Codeine     REACTION: itching    Past Medical History  Diagnosis Date  . Degeneration of cervical intervertebral disc   . Mitral valve disorders   . Personal history of urinary calculi   . Hyperlipidemia     Past Surgical History  Procedure Date  . Abdominal hysterectomy   . Cholecystectomy   . Tonsillectomy     Family History  Problem Relation Age of Onset  . Breast cancer Mother   . Bipolar disorder Father   . Coronary artery disease Paternal Aunt   . Diabetes Neg Hx   . Cancer Neg Hx     History   Social History  . Marital Status: Married    Spouse Name: N/A    Number of Children: 2  . Years of Education: N/A   Occupational History  . [employer] Radiology    Social History Main Topics  . Smoking status: Former Smoker    Types: Cigarettes    Quit date: 08/05/1978  . Smokeless tobacco: Never Used  . Alcohol Use: Yes  . Drug Use: No  . Sexually Active: Not on file   Other Topics Concern  . Not on file   Social History Narrative  . No narrative on file   Review of Systems  Constitutional: Negative for fatigue and unexpected weight change.       Regular exercise  Wears seat belt  HENT: Positive for congestion and  rhinorrhea. Negative for hearing loss, dental problem and tinnitus.        Uses sudafed prn Regular with dentist  Eyes: Negative for visual disturbance.       Needs new Rx No diplopia or unilateral vision loss  Respiratory: Negative for cough, chest tightness and shortness of breath.   Cardiovascular: Positive for leg swelling. Negative for chest pain and palpitations.       Mild swelling in summer---better with hydration and elevation  Gastrointestinal: Negative for nausea, vomiting, abdominal pain, constipation and blood in stool.       No heartburn  Genitourinary: Negative for dysuria, urgency, difficulty urinating and dyspareunia.  Musculoskeletal: Negative for back pain, joint swelling and arthralgias.  Skin: Negative for rash.       Has weird shaped lesion on back of left calf  Neurological: Positive for dizziness and headaches. Negative for syncope, weakness, light-headedness and numbness.       Occ dizzy with nasal congestion More migraines recently---relates to problems with her patch  Hematological: Negative for adenopathy. Does not bruise/bleed easily.  Psychiatric/Behavioral: Positive for disturbed wake/sleep cycle. Negative for dysphoric mood. The patient is not nervous/anxious.  Chronic sleep issues---no daytime somnolence       Objective:   Physical Exam  Constitutional: She is oriented to person, place, and time. She appears well-developed and well-nourished. No distress.  HENT:  Head: Normocephalic and atraumatic.  Right Ear: External ear normal.  Left Ear: External ear normal.  Mouth/Throat: Oropharynx is clear and moist. No oropharyngeal exudate.  Eyes: Conjunctivae and EOM are normal. Pupils are equal, round, and reactive to light.  Neck: Normal range of motion. Neck supple. No thyromegaly present.  Cardiovascular: Normal rate, regular rhythm, normal heart sounds and intact distal pulses.  Exam reveals no gallop.   No murmur heard. Pulmonary/Chest: Effort  normal and breath sounds normal. No respiratory distress. She has no wheezes. She has no rales.  Abdominal: Soft. There is no tenderness.  Genitourinary:       NO breast masses or tenderness  Musculoskeletal: Normal range of motion. She exhibits no edema and no tenderness.  Lymphadenopathy:    She has no cervical adenopathy.    She has no axillary adenopathy.  Neurological: She is alert and oriented to person, place, and time.  Skin: Skin is warm. No rash noted.       Benign nevi on calf  Psychiatric: She has a normal mood and affect. Her behavior is normal.          Assessment & Plan:

## 2012-04-07 NOTE — Assessment & Plan Note (Signed)
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

## 2012-04-07 NOTE — Assessment & Plan Note (Signed)
Discussed primary prevention She prefers to continue to work on lifestyle and hold off on meds

## 2012-04-07 NOTE — Patient Instructions (Addendum)
Please set up your mammogram. 

## 2012-04-09 ENCOUNTER — Encounter: Payer: Self-pay | Admitting: *Deleted

## 2012-04-28 ENCOUNTER — Other Ambulatory Visit: Payer: Self-pay | Admitting: Internal Medicine

## 2012-06-29 ENCOUNTER — Telehealth: Payer: Self-pay

## 2012-06-29 MED ORDER — ESTROGENS CONJUGATED 1.25 MG PO TABS: 1.2500 mg | ORAL_TABLET | Freq: Every day | ORAL | Status: DC

## 2012-06-29 NOTE — Telephone Encounter (Signed)
Pt  requesting estrogen patch changed to oral estrogen due to continuing problem with adhesive to estrogen patch. Brand name of patch is not approved by insurance and too expensive for pt to pay out of pocket.Pt discussed with doctor at 04/07/12 visit. [facility].Please advise.

## 2012-06-29 NOTE — Telephone Encounter (Signed)
Okay to change to estradiol 1mg  daily Have her try the full tab daily for a month and if no problems, tell her to cut down to 1/2 tab daily

## 2012-06-29 NOTE — Telephone Encounter (Signed)
rx sent to pharmacy by e-script Spoke with patient and advised results   

## 2012-07-17 ENCOUNTER — Other Ambulatory Visit: Payer: Self-pay | Admitting: Internal Medicine

## 2012-07-30 ENCOUNTER — Telehealth: Payer: Self-pay

## 2012-07-30 MED ORDER — ESTRADIOL 0.5 MG PO TABS: 0.5000 mg | ORAL_TABLET | Freq: Every day | ORAL | Status: DC

## 2012-07-30 NOTE — Telephone Encounter (Signed)
Pt has concern about change from estrogen patch to pill; pt said when using patch total dose of patch was 0.987  With daily absorption of 0.025 mg. Pt said when talking with pharmacist the oral med at 1.25 mg daily is considered a high dose.Please advise.Pt request call back.[facility].

## 2012-07-30 NOTE — Telephone Encounter (Signed)
See note  Please make report to [facility] now that disposition has been made

## 2012-07-30 NOTE — Telephone Encounter (Signed)
Clearly done incorrectly as I have just reviewed This needs to go to [facility] please

## 2012-07-30 NOTE — Telephone Encounter (Signed)
I had requested the 1mg  tab   Hard to adjust given that oral absorption is different than through the skin That is why I asked her to start with the full tab, then decrease to 1/2 tab daily if no problems after 1 month She can start at 1/2 if she wants, then we can go up if she has symptoms

## 2012-07-30 NOTE — Telephone Encounter (Signed)
Hansel Starling, can you help me with this safety zone portal? Jamesetta So used to do this for [place].

## 2012-07-30 NOTE — Telephone Encounter (Signed)
Tried speaking with patient about the dosage and pt got upset. What is in epic for premarin is 1.25mg  tablet, there isn't a 1mg  tablet. Pt is taking 1/2 tablet, she is trying to compare the dose from the patch to the pill, I explained to pt that there isn't a 0.25mg  pill to match the 0.25mg  patch. Pt states the pharmacist told her that this is a big jump from the lowest strength 0.25 in the patch to the 2nd highest 1.25 mg in pill form. Patient would like a personal call back from [facility], she states this is important and wanted a call back. Please advise.

## 2012-07-30 NOTE — Telephone Encounter (Signed)
I will call her back but I didn't order premarin 1.25mg , I ordered estradiol 1mg ----this is not the same thing. Please review the original order since it does not appear to have been done correctly

## 2012-07-30 NOTE — Telephone Encounter (Signed)
Discussed the issue with the patient Explained that the wrong estrogen had been prescribed by error, and the dose was higher than planned She is going to continue the half of premarin daily till they run out, then switch to the lower dose of estradiol which I just sent in (0.5mg ) She will call if the lower doses prompt any symptoms

## 2012-08-06 ENCOUNTER — Telehealth: Payer: Self-pay | Admitting: Internal Medicine

## 2012-08-06 MED ORDER — MAGIC MOUTHWASH W/LIDOCAINE: 5.0000 mL | Freq: Three times a day (TID) | ORAL | Status: DC | PRN

## 2012-08-06 NOTE — Telephone Encounter (Signed)
Pt left v/m requesting status of mouth wash. Pt request call back at [phone].

## 2012-08-06 NOTE — Telephone Encounter (Signed)
Patient Information:  Caller Name: Arryana  Phone: [phone]  Patient: Sherri Rice, Duggan  Gender: Female  DOB: 02-Feb-1966  Age: 47 Years  PCP: [facility])  Pregnant: No  Office Follow Up:  Does the office need to follow up with this patient?: Yes  Instructions For The Office: asking for a script for MMW  RN Note:  Pharmacy on file  Symptoms  Reason For Call & Symptoms: Ate a potato chip on 12/31 and scratched the back of throat.  Has tried Chloroseptic Throat Spray and it burns really bad and seems to irritate it more.  Asking for MMW  Reviewed Health History In EMR: Yes  Reviewed Medications In EMR: Yes  Reviewed Allergies In EMR: Yes  Reviewed Surgeries / Procedures: Yes  Date of Onset of Symptoms: 08/04/2012  Treatments Tried: Chloroseptic Throat Spray  Treatments Tried Worked: No OB / GYN:  LMP: Unknown  Guideline(s) Used:  No Protocol Available - Information Only  Disposition Per Guideline:   Discuss with PCP and Callback by Nurse Today  Reason For Disposition Reached:   Nursing judgment  Advice Given:  N/A

## 2012-08-06 NOTE — Telephone Encounter (Signed)
Sent in.  Notified patient.

## 2012-09-25 ENCOUNTER — Other Ambulatory Visit: Payer: Self-pay | Admitting: Internal Medicine

## 2012-09-25 NOTE — Telephone Encounter (Signed)
.  left message to have patient return my call.  

## 2012-09-25 NOTE — Telephone Encounter (Signed)
Find out what she uses the tramadol I don't remember discussing it at her physical

## 2012-09-28 NOTE — Telephone Encounter (Signed)
Per patient she uses this for a " disc in my neck" for pain and she will not have surgery on this.

## 2012-09-28 NOTE — Telephone Encounter (Signed)
Okay #90 x 0 

## 2012-09-28 NOTE — Telephone Encounter (Signed)
rx sent to pharmacy by e-script  

## 2012-09-28 NOTE — Telephone Encounter (Signed)
Pt left v/m cking on status of Tramadol refill. Pt can be reached at [phone].

## 2012-09-28 NOTE — Telephone Encounter (Signed)
Pt checking on status of refill.advised sent to pharmacy today at 2 pm. Pt will ck with pharmacy to see if ready for pick up.

## 2012-10-12 ENCOUNTER — Telehealth: Payer: Self-pay

## 2012-10-12 NOTE — Telephone Encounter (Signed)
Pt needs encounter form with legible physician's name on visit that pt has had annual physical since 08/05/2010 for HSA acct.

## 2012-10-13 NOTE — Telephone Encounter (Signed)
Spoke with pt and she request letter with Dr Karle Starch signature that she had annual physical on 04/07/12 with copy of glucose lab faxed to secure fax [phone]. Notified pt done.

## 2012-12-03 ENCOUNTER — Other Ambulatory Visit: Payer: Self-pay | Admitting: Internal Medicine

## 2012-12-03 NOTE — Telephone Encounter (Signed)
Okay #90 x 0 

## 2012-12-04 NOTE — Telephone Encounter (Signed)
Pt request status of refill; advised spoke with Daphne at [facility] and med ready for pick up.

## 2012-12-04 NOTE — Telephone Encounter (Signed)
Phoned in to pharmacy. 

## 2013-02-17 ENCOUNTER — Other Ambulatory Visit: Payer: Self-pay | Admitting: Internal Medicine

## 2013-02-17 NOTE — Telephone Encounter (Signed)
rx sent to pharmacy by e-script Left message asking pt to schedule cpx

## 2013-02-17 NOTE — Telephone Encounter (Signed)
Okay #90 x 0 She is due for a physical after September---have her schedule later in the year

## 2013-03-11 ENCOUNTER — Other Ambulatory Visit: Payer: Self-pay | Admitting: *Deleted

## 2013-03-11 MED ORDER — ESTRADIOL 0.5 MG PO TABS: 0.5000 mg | ORAL_TABLET | Freq: Every day | ORAL | Status: DC

## 2013-04-29 ENCOUNTER — Other Ambulatory Visit: Payer: Self-pay | Admitting: Internal Medicine

## 2013-04-29 NOTE — Telephone Encounter (Signed)
Last seen 04/2012

## 2013-04-29 NOTE — Telephone Encounter (Signed)
rx called into pharmacy .left message to have patient return my call.  

## 2013-04-29 NOTE — Telephone Encounter (Signed)
Okay #90 x 0 Have her set up physical within the next 2-3 months

## 2013-06-28 ENCOUNTER — Other Ambulatory Visit: Payer: Self-pay | Admitting: Internal Medicine

## 2013-06-28 NOTE — Telephone Encounter (Signed)
Last filled 04/29/13, she never scheduled an appt

## 2013-06-28 NOTE — Telephone Encounter (Signed)
We can't keep refilling a now controlled substance when she hasn't been seen in over a year  She will need to schedule an appt

## 2013-06-28 NOTE — Telephone Encounter (Signed)
Denial sent back to pharmarcy Pt needs to schedule appt

## 2013-06-28 NOTE — Telephone Encounter (Signed)
Pt made appt for 12/16, requests refill until that appt.

## 2013-06-29 MED ORDER — TRAMADOL HCL 50 MG PO TABS: 50.0000 mg | ORAL_TABLET | ORAL | Status: DC | PRN

## 2013-06-29 NOTE — Telephone Encounter (Signed)
That is the most I will give her for a month She may use more on one day or none on another I need to talk to her more about this at our visit

## 2013-06-29 NOTE — Telephone Encounter (Signed)
rx called into pharmacy

## 2013-06-29 NOTE — Telephone Encounter (Signed)
Okay #90 x 0 

## 2013-06-29 NOTE — Addendum Note (Signed)
Addended by: Sueanne Margarita on: 06/29/2013 09:21 AM   Modules accepted: Orders

## 2013-06-29 NOTE — Addendum Note (Signed)
Addended by: Sueanne Margarita on: 06/29/2013 11:52 AM   Modules accepted: Orders

## 2013-06-29 NOTE — Telephone Encounter (Signed)
Are the instructions correct, 1 tablet every 4 hours as needed, 6 tablets per day? With #90 is only a 15 day supply, please advise

## 2013-07-19 ENCOUNTER — Encounter: Payer: Self-pay | Admitting: Radiology

## 2013-07-20 ENCOUNTER — Ambulatory Visit: Payer: BC Managed Care – PPO | Admitting: Internal Medicine

## 2013-07-23 ENCOUNTER — Ambulatory Visit: Payer: BC Managed Care – PPO | Admitting: Internal Medicine

## 2013-07-23 ENCOUNTER — Encounter: Payer: Self-pay | Admitting: Radiology

## 2013-07-23 DIAGNOSIS — Z0289 Encounter for other administrative examinations: Secondary | ICD-10-CM

## 2013-10-12 ENCOUNTER — Other Ambulatory Visit: Payer: Self-pay

## 2013-10-12 NOTE — Telephone Encounter (Signed)
Pt left v/m; pt had to reschedule CPX until 10/29/13; pt last seen 04/07/12; pt is going out of town on business and pt [facility].Please advise.

## 2013-10-13 MED ORDER — TRAMADOL HCL 50 MG PO TABS: 50.0000 mg | ORAL_TABLET | ORAL | Status: DC | PRN

## 2013-10-13 MED ORDER — ESTRADIOL 0.5 MG PO TABS: 0.5000 mg | ORAL_TABLET | Freq: Every day | ORAL | Status: DC

## 2013-10-13 NOTE — Telephone Encounter (Signed)
Refills called to pharmacy per Dr. Alphonsus SiasLetvak and okay to give #90 on the Estrace if that is how she normally gets it.

## 2013-10-13 NOTE — Telephone Encounter (Signed)
Pt went to [facility] and was told rx had not been called in. Spoke with Aundra MilletMegan at [facility] and rx ready for pick up.pt voiced understanding.

## 2013-10-13 NOTE — Telephone Encounter (Signed)
Okay tramadol #90 x 0 Estrace for 1 month x0

## 2013-10-19 ENCOUNTER — Encounter: Payer: BC Managed Care – PPO | Admitting: Internal Medicine

## 2013-10-29 ENCOUNTER — Encounter: Payer: Self-pay | Admitting: Internal Medicine

## 2013-10-29 ENCOUNTER — Ambulatory Visit (INDEPENDENT_AMBULATORY_CARE_PROVIDER_SITE_OTHER): Payer: BC Managed Care – PPO | Admitting: Internal Medicine

## 2013-10-29 VITALS — BP 120/70 | HR 97 | Temp 98.0°F | Ht 64.0 in | Wt 158.0 lb

## 2013-10-29 DIAGNOSIS — Z0000 Encounter for general adult medical examination without abnormal findings: Secondary | ICD-10-CM

## 2013-10-29 DIAGNOSIS — R5381 Other malaise: Secondary | ICD-10-CM

## 2013-10-29 DIAGNOSIS — R5383 Other fatigue: Secondary | ICD-10-CM

## 2013-10-29 LAB — T4, FREE: Free T4: 0.71 ng/dL (ref 0.60–1.60)

## 2013-10-29 LAB — LIPID PANEL
Cholesterol: 215 mg/dL — ABNORMAL HIGH (ref 0–200)
HDL: 65.7 mg/dL
LDL Cholesterol: 137 mg/dL — ABNORMAL HIGH (ref 0–99)
Total CHOL/HDL Ratio: 3
Triglycerides: 62 mg/dL (ref 0.0–149.0)
VLDL: 12.4 mg/dL (ref 0.0–40.0)

## 2013-10-29 LAB — COMPREHENSIVE METABOLIC PANEL WITH GFR
ALT: 21 U/L (ref 0–35)
AST: 23 U/L (ref 0–37)
Albumin: 4.3 g/dL (ref 3.5–5.2)
Alkaline Phosphatase: 102 U/L (ref 39–117)
BUN: 11 mg/dL (ref 6–23)
CO2: 26 meq/L (ref 19–32)
Calcium: 9.3 mg/dL (ref 8.4–10.5)
Chloride: 103 meq/L (ref 96–112)
Creatinine, Ser: 1 mg/dL (ref 0.4–1.2)
GFR: 61.01 mL/min
Glucose, Bld: 89 mg/dL (ref 70–99)
Potassium: 4.7 meq/L (ref 3.5–5.1)
Sodium: 138 meq/L (ref 135–145)
Total Bilirubin: 0.5 mg/dL (ref 0.3–1.2)
Total Protein: 6.9 g/dL (ref 6.0–8.3)

## 2013-10-29 LAB — CBC WITH DIFFERENTIAL/PLATELET
Basophils Absolute: 0 K/uL (ref 0.0–0.1)
Basophils Relative: 0.6 % (ref 0.0–3.0)
Eosinophils Absolute: 0.1 K/uL (ref 0.0–0.7)
Eosinophils Relative: 2.1 % (ref 0.0–5.0)
HCT: 43 % (ref 36.0–46.0)
Hemoglobin: 14.4 g/dL (ref 12.0–15.0)
Lymphocytes Relative: 29.3 % (ref 12.0–46.0)
Lymphs Abs: 1.5 K/uL (ref 0.7–4.0)
MCHC: 33.5 g/dL (ref 30.0–36.0)
MCV: 91.8 fl (ref 78.0–100.0)
Monocytes Absolute: 0.4 K/uL (ref 0.1–1.0)
Monocytes Relative: 8.2 % (ref 3.0–12.0)
Neutro Abs: 3.1 K/uL (ref 1.4–7.7)
Neutrophils Relative %: 59.8 % (ref 43.0–77.0)
Platelets: 265 K/uL (ref 150.0–400.0)
RBC: 4.68 Mil/uL (ref 3.87–5.11)
RDW: 12.6 % (ref 11.5–14.6)
WBC: 5.2 K/uL (ref 4.5–10.5)

## 2013-10-29 LAB — TSH: TSH: 1.48 u[IU]/mL (ref 0.35–5.50)

## 2013-10-29 LAB — SEDIMENTATION RATE: Sed Rate: 19 mm/hr (ref 0–22)

## 2013-10-29 NOTE — Assessment & Plan Note (Signed)
Fairly healthy but out of shape Will set up her mammogram

## 2013-10-29 NOTE — Progress Notes (Signed)
Subjective:    Patient ID: Sherri Rice, female    DOB: May 23, 1966, 48 y.o.   MRN: [id number]  HPI Here for physical Has noted some "unusual tiredness" Will sleep around the clock at times--was 12 hours per day in January. Now not sleeping well even at night Not healing well-- minor skin injuries just don't heal up Checked in [place] once--ulcer wasn't MRSA (got topical antibiotic) Not exercising lately. Weight is up now  Current Outpatient Prescriptions on File Prior to Visit  Medication Sig Dispense Refill  . traMADol (ULTRAM) 50 MG tablet Take 1 tablet (50 mg total) by mouth every 4 (four) hours as needed.  90 tablet  0   No current facility-administered medications on file prior to visit.    Allergies  Allergen Reactions  . Codeine     REACTION: itching    Past Medical History  Diagnosis Date  . Degeneration of cervical intervertebral disc   . Mitral valve disorders   . Personal history of urinary calculi   . Hyperlipidemia     Past Surgical History  Procedure Laterality Date  . Abdominal hysterectomy    . Cholecystectomy    . Tonsillectomy      Family History  Problem Relation Age of Onset  . Breast cancer Mother   . Bipolar disorder Father   . Coronary artery disease Paternal Aunt   . Diabetes Neg Hx   . Cancer Neg Hx     History   Social History  . Marital Status: Married    Spouse Name: N/A    Number of Children: 2  . Years of Education: N/A   Occupational History  . [employer]     Radiology PA   Social History Main Topics  . Smoking status: Former Smoker    Types: Cigarettes    Quit date: 08/05/1978  . Smokeless tobacco: Never Used  . Alcohol Use: Yes  . Drug Use: No  . Sexual Activity: Not on file   Other Topics Concern  . Not on file   Social History Narrative  . No narrative on file   Review of Systems  Constitutional: Positive for fatigue and unexpected weight change.       Weight is up a few pounds Wears seat  belt  HENT: Negative for hearing loss and tinnitus.        Regular with dentist  Eyes: Negative for visual disturbance.       Has tic in left eye No diplopia or unilateral vision loss  Respiratory: Negative for cough, chest tightness and shortness of breath.   Cardiovascular: Positive for palpitations. Negative for chest pain and leg swelling.       Palps if sick Has MVP  Gastrointestinal: Negative for nausea, vomiting, abdominal pain, constipation and blood in stool.       No heartburn  Endocrine: Positive for polyphagia. Negative for polydipsia and polyuria.  Genitourinary: Negative for dyspareunia.       2 UTIs in past year  Musculoskeletal: Negative for arthralgias, back pain and joint swelling.  Skin: Positive for rash.       Truncal rash comes and goes (slower)  Allergic/Immunologic: Negative for environmental allergies and immunocompromised state.  Neurological: Positive for headaches. Negative for dizziness, syncope, weakness, light-headedness and numbness.       No longer taking imitrex. Uses excedrin migraine. Discussed weaning down on caffeine  Hematological: Negative for adenopathy. Does not bruise/bleed easily.  Psychiatric/Behavioral: Positive for sleep disturbance. Negative for dysphoric mood.  The patient is not nervous/anxious.        Objective:   Physical Exam  Constitutional: Sherri Rice is oriented to person, place, and time. Sherri Rice appears well-developed and well-nourished. No distress.  HENT:  Head: Normocephalic and atraumatic.  Right Ear: External ear normal.  Left Ear: External ear normal.  Mouth/Throat: Oropharynx is clear and moist. No oropharyngeal exudate.  Eyes: Conjunctivae and EOM are normal. Pupils are equal, round, and reactive to light.  Neck: Normal range of motion. Neck supple. No thyromegaly present.  Cardiovascular: Normal rate, regular rhythm, normal heart sounds and intact distal pulses.  Exam reveals no gallop.   No murmur heard. Pulmonary/Chest:  Effort normal and breath sounds normal. No respiratory distress. Sherri Rice has no wheezes. Sherri Rice has no rales.  Abdominal: Soft. There is no tenderness.  Genitourinary:  No breast masses or tenderness  Musculoskeletal: Sherri Rice exhibits no edema and no tenderness.  Lymphadenopathy:    Sherri Rice has no cervical adenopathy.  Neurological: Sherri Rice is alert and oriented to person, place, and time.  Skin:  Scattered small skin breaks mostly upper chest and back  Psychiatric: Sherri Rice has a normal mood and affect. Her behavior is normal.          Assessment & Plan:

## 2013-10-29 NOTE — Patient Instructions (Addendum)
Please set up your screening mammogram.  DASH Diet The DASH diet stands for "Dietary Approaches to Stop Hypertension." It is a healthy eating plan that has been shown to reduce high blood pressure (hypertension) in as little as 14 days, while also possibly providing other significant health benefits. These other health benefits include reducing the risk of breast cancer after menopause and reducing the risk of type 2 diabetes, heart disease, colon cancer, and stroke. Health benefits also include weight loss and slowing kidney failure in patients with chronic kidney disease.  DIET GUIDELINES  Limit salt (sodium). Your diet should contain less than 1500 mg of sodium daily.  Limit refined or processed carbohydrates. Your diet should include mostly whole grains. Desserts and added sugars should be used sparingly.  Include small amounts of heart-healthy fats. These types of fats include nuts, oils, and tub margarine. Limit saturated and trans fats. These fats have been shown to be harmful in the body. CHOOSING FOODS  The following food groups are based on a 2000 calorie diet. See your Registered Dietitian for individual calorie needs. Grains and Grain Products (6 to 8 servings daily)  Eat More Often: Whole-wheat bread, brown rice, whole-grain or wheat pasta, quinoa, popcorn without added fat or salt (air popped).  Eat Less Often: White bread, white pasta, white rice, cornbread. Vegetables (4 to 5 servings daily)  Eat More Often: Fresh, frozen, and canned vegetables. Vegetables may be raw, steamed, roasted, or grilled with a minimal amount of fat.  Eat Less Often/Avoid: Creamed or fried vegetables. Vegetables in a cheese sauce. Fruit (4 to 5 servings daily)  Eat More Often: All fresh, canned (in natural juice), or frozen fruits. Dried fruits without added sugar. One hundred percent fruit juice ( cup [237 mL] daily).  Eat Less Often: Dried fruits with added sugar. Canned fruit in light or heavy  syrup. Lean Meats, Fish, and Poultry (2 servings or less daily. One serving is 3 to 4 oz [85-114 g]).  Eat More Often: Ninety percent or leaner ground beef, tenderloin, sirloin. Round cuts of beef, chicken breast, turkey breast. All fish. Grill, bake, or broil your meat. Nothing should be fried.  Eat Less Often/Avoid: Fatty cuts of meat, turkey, or chicken leg, thigh, or wing. Fried cuts of meat or fish. Dairy (2 to 3 servings)  Eat More Often: Low-fat or fat-free milk, low-fat plain or light yogurt, reduced-fat or part-skim cheese.  Eat Less Often/Avoid: Milk (whole, 2%).Whole milk yogurt. Full-fat cheeses. Nuts, Seeds, and Legumes (4 to 5 servings per week)  Eat More Often: All without added salt.  Eat Less Often/Avoid: Salted nuts and seeds, canned beans with added salt. Fats and Sweets (limited)  Eat More Often: Vegetable oils, tub margarines without trans fats, sugar-free gelatin. Mayonnaise and salad dressings.  Eat Less Often/Avoid: Coconut oils, palm oils, butter, stick margarine, cream, half and half, cookies, candy, pie. FOR MORE INFORMATION The Dash Diet Eating Plan: www.dashdiet.org Document Released: 07/11/2011 Document Revised: 10/14/2011 Document Reviewed: 07/11/2011 ExitCare Patient Information 2014 [employer].  

## 2013-10-29 NOTE — Progress Notes (Signed)
Pre visit review using our clinic review tool, if applicable. No additional management support is needed unless otherwise documented below in the visit note. 

## 2013-10-29 NOTE — Assessment & Plan Note (Signed)
Along with poor skin healing, craving sweets, etc Will check labs Needs to start exercising, eating right, etc

## 2013-11-02 ENCOUNTER — Encounter: Payer: Self-pay | Admitting: *Deleted

## 2013-12-07 ENCOUNTER — Telehealth: Payer: Self-pay

## 2013-12-07 MED ORDER — ALPRAZOLAM 0.25 MG PO TABS: 0.2500 mg | ORAL_TABLET | Freq: Two times a day (BID) | ORAL | Status: DC | PRN

## 2013-12-07 NOTE — Telephone Encounter (Signed)
rx called into pharmacy Spoke with patient and advised results   

## 2013-12-07 NOTE — Telephone Encounter (Signed)
Pt left v/m; pt recently seen and pt said discussed with Dr Alphonsus SiasLetvak about pts stress level. Pt request [facility] to [facility]. Pt said if pt needs to be seen again to let pt know.Please advise.

## 2013-12-07 NOTE — Telephone Encounter (Signed)
Okay to send alprazolam 0.25 bid prn for stress #20 x 0 This is only for rare use If she is having ongoing issues, or needs more, she will need an appt

## 2014-01-07 ENCOUNTER — Other Ambulatory Visit: Payer: Self-pay | Admitting: Internal Medicine

## 2014-01-07 NOTE — Telephone Encounter (Signed)
rx called into pharmacy

## 2014-01-07 NOTE — Telephone Encounter (Signed)
Okay #90 x 0 

## 2014-01-28 ENCOUNTER — Telehealth: Payer: Self-pay

## 2014-01-28 MED ORDER — SULFAMETHOXAZOLE-TMP DS 800-160 MG PO TABS: 1.0000 | ORAL_TABLET | Freq: Two times a day (BID) | ORAL | Status: DC

## 2014-01-28 NOTE — Telephone Encounter (Signed)
Let her know I have sent Rx for 3 days of sulfa drug If her symptoms don't respond, or she has recurrence, we will need to see her

## 2014-01-28 NOTE — Telephone Encounter (Signed)
Left message on machine that rx was sent in and to follow-up if needed

## 2014-01-28 NOTE — Telephone Encounter (Signed)
Pt is out of town and [facility] and given Macrobid 100 mg to take twice a day for 5 days. Pt finished med on 01/26/14. Pt felt better when finished med; last night pt had burning and pain upon urination; pt said urine has "infection smell". Pt has temp 99.1 Pt will not be returning home until late tonight or early on 01/29/14. [facility] Pt will get family member to pick up med today. Pt request cb.

## 2014-04-01 ENCOUNTER — Other Ambulatory Visit: Payer: Self-pay | Admitting: Internal Medicine

## 2014-04-01 NOTE — Telephone Encounter (Signed)
01/07/14 

## 2014-04-01 NOTE — Telephone Encounter (Signed)
rx called into pharmacy

## 2014-04-01 NOTE — Telephone Encounter (Signed)
Okay #90 x 0 

## 2014-04-06 ENCOUNTER — Other Ambulatory Visit: Payer: Self-pay | Admitting: Internal Medicine

## 2014-04-06 NOTE — Telephone Encounter (Signed)
12/07/13 

## 2014-04-07 NOTE — Telephone Encounter (Signed)
rx called into pharmacy

## 2014-04-07 NOTE — Telephone Encounter (Signed)
Okay #30 x 0 

## 2014-05-30 ENCOUNTER — Telehealth: Payer: Self-pay | Admitting: Internal Medicine

## 2014-05-30 ENCOUNTER — Ambulatory Visit (INDEPENDENT_AMBULATORY_CARE_PROVIDER_SITE_OTHER): Payer: BC Managed Care – PPO | Admitting: Internal Medicine

## 2014-05-30 ENCOUNTER — Encounter: Payer: Self-pay | Admitting: Internal Medicine

## 2014-05-30 VITALS — BP 110/68 | HR 91 | Temp 97.7°F | Wt 169.0 lb

## 2014-05-30 DIAGNOSIS — B960 Mycoplasma pneumoniae [M. pneumoniae] as the cause of diseases classified elsewhere: Secondary | ICD-10-CM

## 2014-05-30 DIAGNOSIS — J157 Pneumonia due to Mycoplasma pneumoniae: Secondary | ICD-10-CM

## 2014-05-30 MED ORDER — DOXYCYCLINE HYCLATE 100 MG PO TABS: 100.0000 mg | ORAL_TABLET | Freq: Two times a day (BID) | ORAL | Status: DC

## 2014-05-30 NOTE — Telephone Encounter (Signed)
You can add her today at 12:30PM if she can make it Otherwise offer 12:30 tomorrow

## 2014-05-30 NOTE — Telephone Encounter (Signed)
I spoke with patient and she'll be here at 12:30 today.

## 2014-05-30 NOTE — Assessment & Plan Note (Signed)
Has ongoing respiratory symptoms over 3 weeks Apparent bullous myringitis on left and now dry cough Will treat with doxy ENT eval if the ear isn't improving by next week

## 2014-05-30 NOTE — Patient Instructions (Addendum)
If your ear is not better by next week, I will set you up with a ENT doctor. This infection may last another 3-4 weeks--but I hope you should start feeling better before too long.

## 2014-05-30 NOTE — Telephone Encounter (Signed)
Patient Information:  Caller Name: Sherri Rice  Phone: [phone]  Patient: Sherri Rice, Sherri Rice  Gender: Female  DOB: 02/12/1966  Age: 48 Years  PCP: Tillman AbideLetvak , [facility](Family Practice)  Pregnant: No  Office Follow Up:  Does the office need to follow up with this patient?: Yes  Instructions For The Office: No appts. available at the [facility]. Patient agreed to be seen at the [facility]. No appt.s available at [facility] or [facility] office. Patient declines for RN to check at another office location.  Disposition obtained of " See Provider today." Please return call to patient at [phone] regarding possible work in appt. for 05/30/14.  RN Note:  LMP: Hx Hysterectomy. Patient states she developed fever, sinus pain, bilateral ear pain, congestion, cough. Onset 05/09/14. Patient states she has had a fever "everyday" since 05/09/14. Patient states she was seen at [facility] 05/24/14 and was diagnosed with Bilateral Otitis Media. Patient states she was given an Injection of Rocephin and prescribed Augmentin BID X 10 days. Patient states no improvement in sx. Patient states cough is non-productive. States she continues to have sinus pain/pressure, ear congestion and ringing in her ears. Patient states she has cloudy, yellowish discharge from both ears. Patient has not checked her temperature 05/30/14. Patient states, " I am currently in the sweating phase." Patient is reluctant to answer questions for triage assessment. Patient states she has an appt. scheduled for 05/31/14. Patient is requesting an appt, for 05/30/14 with Dr. Alphonsus SiasLetvak.  Care advice given per guidelines. Patient advised increased fluids, warm fluids with honey, inhaled steam, humidifier, saline nasal washes. Patient advised to continue Augmentin as prescribed.  Call back parameters reviewed. Patient verbalizes understanding.  No appts. available at the [facility]. Patient agreed to be seen at  the [facility]. No appt.s available at [facility] or [facility] office. Patient declines for RN to check at another office location.  Disposition obtained of " See Provider today." Please return call to patient at [phone] regarding possible work in appt. for 05/30/14.   Symptoms  Reason For Call & Symptoms: Cough, congestion, sinus pain, bilateral ear pain, fever  Reviewed Health History In EMR: Yes  Reviewed Medications In EMR: Yes  Reviewed Allergies In EMR: Yes  Reviewed Surgeries / Procedures: Yes  Date of Onset of Symptoms: 05/09/2014  Treatments Tried: Rocephin, Amoxicillin, Sudafed, Dayquil  Treatments Tried Worked: No  Any Fever: Yes  Fever Taken: Tactile  Fever Time Of Reading: 10:30:00  Fever Last Reading: N/A OB / GYN:  LMP: Unknown  Guideline(s) Used:  Ear - Congestion  Hearing Loss  Colds  Disposition Per Guideline:   See Today in Office  Reason For Disposition Reached:   Fever present > 3 days (72 hours)  Advice Given:  Call Back If:   Ear pain or fever occurs  You become worse.  For a Stuffy Nose - Use Nasal Washes:  Introduction: Saline (salt water) nasal irrigation (nasal wash) is an effective and simple home remedy for treating stuffy nose and sinus congestion. The nose can be irrigated by pouring, spraying, or squirting salt water into the nose and then letting it run back out.  Humidifier:  If the air in your home is dry, use a cool-mist humidifier  Call Back If:  Difficulty breathing occurs  Patient Will Follow Care Advice:  YES

## 2014-05-30 NOTE — Progress Notes (Signed)
   Subjective:    Patient ID: Sherri Rice, female    DOB: 10/01/1965, 48 y.o.   MRN: [id number]  HPI Has been sick since early October Drainage from left ear started on the 5th Hearing was very bad Ongoing noise in head---like when eating Fever for 3 weeks now Headache and ears ringing Face hurts but not clearly like sinus congestion  Went to urgent care on 11/20 Got rocephin and augmentin Rx  Nothing from nose Has burning in chest recently --with some dry cough Burning with deep breath  Tried mucinex--no help Sudafed-- no real help--just made her thirsty (but nose is not congested like usual)  Current Outpatient Prescriptions on File Prior to Visit  Medication Sig Dispense Refill  . ALPRAZolam (XANAX) 0.25 MG tablet TAKE 1 TABLET BY MOUTH TWICE A DAY AS NEEDED  30 tablet  0  . estradiol (ESTRACE) 0.5 MG tablet Take 0.5 mg by mouth daily.      . traMADol (ULTRAM) 50 MG tablet TAKE 1 TABLET EVERY 4 HOURS AS NEEDED FOR PAIN  90 tablet  0   No current facility-administered medications on file prior to visit.    Allergies  Allergen Reactions  . Codeine     REACTION: itching    Past Medical History  Diagnosis Date  . Degeneration of cervical intervertebral disc   . Mitral valve disorders   . Personal history of urinary calculi   . Hyperlipidemia     Past Surgical History  Procedure Laterality Date  . Abdominal hysterectomy    . Cholecystectomy    . Tonsillectomy      Family History  Problem Relation Age of Onset  . Breast cancer Mother   . Bipolar disorder Father   . Coronary artery disease Paternal Aunt   . Diabetes Neg Hx   . Cancer Neg Hx     History   Social History  . Marital Status: Married    Spouse Name: N/A    Number of Children: 2  . Years of Education: N/A   Occupational History  . [employer]     Radiology PA   Social History Main Topics  . Smoking status: Former Smoker    Types: Cigarettes    Quit date: 08/05/1978  .  Smokeless tobacco: Never Used  . Alcohol Use: Yes  . Drug Use: No  . Sexual Activity: Not on file   Other Topics Concern  . Not on file   Social History Narrative  . No narrative on file   Review of Systems No rash No vomiting or diarrhea    Objective:   Physical Exam  Constitutional: She appears well-developed and well-nourished. No distress.  Looks tired but no distress  HENT:  Mouth/Throat: Oropharynx is clear and moist. No oropharyngeal exudate.  No sinus tenderness Tragal sensitivity bilaterally (chronic). Both canals normal Right TM normal Left TM seems to have some bulging and yellow changes (bullous) Marked pale nasal congestion  Neck: Normal range of motion. Neck supple. No thyromegaly present.  Pulmonary/Chest: Effort normal and breath sounds normal. No respiratory distress. She has no wheezes. She has no rales.  Lymphadenopathy:    She has no cervical adenopathy.          Assessment & Plan:

## 2014-05-30 NOTE — Progress Notes (Signed)
Pre visit review using our clinic review tool, if applicable. No additional management support is needed unless otherwise documented below in the visit note. 

## 2014-05-31 ENCOUNTER — Ambulatory Visit: Payer: BC Managed Care – PPO | Admitting: Internal Medicine

## 2014-06-24 ENCOUNTER — Telehealth: Payer: Self-pay | Admitting: Internal Medicine

## 2014-06-24 MED ORDER — DOXYCYCLINE HYCLATE 100 MG PO TABS: 100.0000 mg | ORAL_TABLET | Freq: Two times a day (BID) | ORAL | Status: DC

## 2014-06-24 NOTE — Telephone Encounter (Signed)
Pt was prescribed abx that she finished almost week ago. Her sxs have been coming back over the past few days. Pt request another round of abx called in. [facility] [place] [place] [address]; Store # [id number] P: [phone]

## 2014-06-24 NOTE — Telephone Encounter (Signed)
Okay to send another Rx for doxycycline hyclate 100mg  bid #20 x 0 Make sure she is at least some better though

## 2014-06-24 NOTE — Telephone Encounter (Signed)
Pt called and pt advised as instructed from phone note; [facility] as instructed. Pt said that she felt much better; but when pt finished antibiotic pt felt "cloudiness" in rt ear, head congestion and continuing cough. However the cough is much better than when seen in office end of Oct. Pt will cb if symptoms not cleared when finish antibiotic.

## 2014-06-25 NOTE — Telephone Encounter (Signed)
This sounds consistent with the time course for M. Pneumoniae infection

## 2014-06-27 ENCOUNTER — Telehealth: Payer: Self-pay

## 2014-06-27 MED ORDER — FLUCONAZOLE 150 MG PO TABS: 150.0000 mg | ORAL_TABLET | Freq: Every day | ORAL | Status: DC

## 2014-06-27 NOTE — Telephone Encounter (Signed)
Pt left v/m;pt [facility]an [facility] due to vaginitis symptoms due to taking antibiotics over last several weeks. Please advise.

## 2014-06-27 NOTE — Telephone Encounter (Signed)
Okay to send fluconazole 150mg   #2 x 0 Take 1 now--repeat in 1 week prn

## 2014-06-27 NOTE — Telephone Encounter (Signed)
rx sent to pharmacy by e-script  

## 2014-08-08 ENCOUNTER — Other Ambulatory Visit: Payer: Self-pay | Admitting: Internal Medicine

## 2014-08-08 NOTE — Telephone Encounter (Signed)
rx called into pharmacy

## 2014-08-08 NOTE — Telephone Encounter (Signed)
04/01/14 

## 2014-08-08 NOTE — Telephone Encounter (Signed)
Approved:#90 x 0 Have her schedule her yearly physical within the next 6 months or so

## 2014-10-03 ENCOUNTER — Ambulatory Visit (INDEPENDENT_AMBULATORY_CARE_PROVIDER_SITE_OTHER): Payer: BLUE CROSS/BLUE SHIELD | Admitting: Internal Medicine

## 2014-10-03 ENCOUNTER — Encounter: Payer: Self-pay | Admitting: Internal Medicine

## 2014-10-03 VITALS — BP 122/78 | HR 87 | Temp 97.7°F | Wt 159.4 lb

## 2014-10-03 DIAGNOSIS — H9213 Otorrhea, bilateral: Secondary | ICD-10-CM

## 2014-10-03 DIAGNOSIS — H9210 Otorrhea, unspecified ear: Secondary | ICD-10-CM | POA: Insufficient documentation

## 2014-10-03 NOTE — Progress Notes (Signed)
Pre visit review using our clinic review tool, if applicable. No additional management support is needed unless otherwise documented below in the visit note. 

## 2014-10-03 NOTE — Assessment & Plan Note (Signed)
Mostly on right I am concerned about possible cholesteatoma No acute infection now Will set up with ENT

## 2014-10-03 NOTE — Progress Notes (Signed)
   Subjective:    Patient ID: Sherri Rice, female    DOB: 01/20/1966, 49 y.o.   MRN: [id number]  HPI Here due to ear problems  Had been on antibiotics for pneumonia and ear problems Still with "weird discharge" from her ears (withy ruptured TMs) Gets white fluffy stuff coming out every morning  Several courses of antibiotics between here and Urgent Care in [place]--where she spends a lot of time Septra, doxy, rocephin etc  Did take fluconazole for vaginal yeast--this helped that  Hearing isn't great in right ear No ear pain No fever Seems to be over the infections  Current Outpatient Prescriptions on File Prior to Visit  Medication Sig Dispense Refill  . ALPRAZolam (XANAX) 0.25 MG tablet TAKE 1 TABLET BY MOUTH TWICE A DAY AS NEEDED 30 tablet 0  . estradiol (ESTRACE) 0.5 MG tablet Take 0.5 mg by mouth daily.    . traMADol (ULTRAM) 50 MG tablet TAKE 1 TABLET BY MOUTH EVERY 4 HOURS AS NEEDED FOR PAIN 90 tablet 0   No current facility-administered medications on file prior to visit.    Allergies  Allergen Reactions  . Codeine     REACTION: itching    Past Medical History  Diagnosis Date  . Degeneration of cervical intervertebral disc   . Mitral valve disorders   . Personal history of urinary calculi   . Hyperlipidemia     Past Surgical History  Procedure Laterality Date  . Abdominal hysterectomy    . Cholecystectomy    . Tonsillectomy      Family History  Problem Relation Age of Onset  . Breast cancer Mother   . Bipolar disorder Father   . Coronary artery disease Paternal Aunt   . Diabetes Neg Hx   . Cancer Neg Hx     History   Social History  . Marital Status: Married    Spouse Name: N/A  . Number of Children: 2  . Years of Education: N/A   Occupational History  . [employer]     Radiology [employer]   Social History Main Topics  . Smoking status: Former Smoker    Types: Cigarettes    Quit date: 08/05/1978  . Smokeless tobacco: Never Used  .  Alcohol Use: Yes  . Drug Use: No  . Sexual Activity: Not on file   Other Topics Concern  . Not on file   Social History Narrative   Review of Systems No fever No cough    Objective:   Physical Exam  Constitutional: She appears well-developed and well-nourished. No distress.  HENT:  Left ear canal is fairly normal TM is not inflamed but somewhat opaque  Right TM is opaque white without inflammation Yellowish discoloration along lateral inferior portion of TM          Assessment & Plan:

## 2014-10-28 ENCOUNTER — Other Ambulatory Visit: Payer: Self-pay | Admitting: Internal Medicine

## 2014-10-31 NOTE — Telephone Encounter (Signed)
rx called into pharmacy

## 2014-10-31 NOTE — Telephone Encounter (Signed)
Last office visit 10/03/2014.  Last refilled 08/08/2014 for #90 with no refills.  Ok to refill?

## 2014-10-31 NOTE — Telephone Encounter (Signed)
Approved: #90 x 0 Due for physical any time---she should schedule in the next few months

## 2015-01-04 HISTORY — PX: CARDIOVASCULAR STRESS TEST: SHX262

## 2015-01-09 ENCOUNTER — Other Ambulatory Visit: Payer: Self-pay | Admitting: Internal Medicine

## 2015-01-09 NOTE — Telephone Encounter (Signed)
rx called into pharmacy Left message for patient to schedule appt soon

## 2015-01-09 NOTE — Telephone Encounter (Signed)
10/31/14 

## 2015-01-09 NOTE — Telephone Encounter (Signed)
Approved: #90 x 0 She is overdue for physical Have her set up within the next few months

## 2015-02-14 ENCOUNTER — Telehealth: Payer: Self-pay | Admitting: Internal Medicine

## 2015-02-14 NOTE — Telephone Encounter (Signed)
Patient Name: Sherri LatheNGELA Muscato DOB: 04/30/1966 Initial Comment caller states she has right flank pain and burning urination x3 days now, has tried OTC meds, would like something called in to [facility] [address] [phone]. She is out of town until Friday and prefers not to go to a doc in the box. Nurse Assessment Nurse: Charna Elizabethrumbull, RN, Cathy Date/Time (Eastern Time): 02/14/2015 12:31:44 PM Confirm and document reason for call. If symptomatic, describe symptoms. ---Caller states she developed right flank pain, low grade fever and pain with urination about 3 days ago. She is out of town would like something called in to [facility] [address] [phone]. She is out of town until Friday and prefers not to go to a doc in the box. Has the patient traveled out of the country within the last 30 days? ---No Does the patient require triage? ---Yes Related visit to physician within the last 2 weeks? ---No Does the PT have any chronic conditions? (i.e. diabetes, asthma, etc.) ---No Did the patient indicate they were pregnant? ---No Guidelines Guideline Title Affirmed Question Affirmed Notes Flank Pain [1] SEVERE pain (e.g., excruciating, scale 8-10) AND [2] present > 1 hour Final Disposition User Go to ED Now Charna Elizabethrumbull, RN, [employer]athy Comments Cheyeanne declined the Go to ER disposition. Reinforced the Go to ER disposition. She requests that the MD call something in for her. Called the office backline and notified Dee. She states she will call Loree back. Referrals GO TO FACILITY REFUSED Disagree/Comply: Disagree Disagree/Comply Reason: Disagree with instructions

## 2015-02-14 NOTE — Telephone Encounter (Signed)
.  left message to have patient return my call.  

## 2015-02-14 NOTE — Telephone Encounter (Signed)
It is against our guidelines to prescribe over the phone I think urinary symptoms is on the list that can be done by an e-visit---please check on that and let her know (she needs to be active on MyChart though)

## 2015-02-14 NOTE — Telephone Encounter (Signed)
Pt left v/m that she has spoken with team health; pt is in [place] until 02/17/15; pt request abx and request cb [phone].

## 2015-02-14 NOTE — Telephone Encounter (Signed)
Spoke with patient and she stated she will go to UC, but she wanted Dr.Letvak to prescribe and going to a UC doesn't do her any good, pt was upset, said thank you and hung up.

## 2015-03-20 ENCOUNTER — Other Ambulatory Visit: Payer: Self-pay | Admitting: Internal Medicine

## 2015-03-20 NOTE — Telephone Encounter (Signed)
01/09/15 

## 2015-03-20 NOTE — Telephone Encounter (Signed)
Approved: okay #90 x 0 She should set up for a PE in the upcoming months

## 2015-03-20 NOTE — Telephone Encounter (Signed)
rx called into pharmacy

## 2015-06-03 ENCOUNTER — Emergency Department (HOSPITAL_COMMUNITY): Payer: BLUE CROSS/BLUE SHIELD

## 2015-06-03 ENCOUNTER — Emergency Department (HOSPITAL_COMMUNITY)
Admission: EM | Admit: 2015-06-03 | Discharge: 2015-06-03 | Disposition: A | Discharge status: Home / Self Care | Payer: BLUE CROSS/BLUE SHIELD | Attending: Emergency Medicine | Admitting: Emergency Medicine

## 2015-06-03 ENCOUNTER — Encounter (HOSPITAL_COMMUNITY): Payer: Self-pay | Admitting: Emergency Medicine

## 2015-06-03 DIAGNOSIS — Z8679 Personal history of other diseases of the circulatory system: Secondary | ICD-10-CM | POA: Insufficient documentation

## 2015-06-03 DIAGNOSIS — Z8739 Personal history of other diseases of the musculoskeletal system and connective tissue: Secondary | ICD-10-CM | POA: Insufficient documentation

## 2015-06-03 DIAGNOSIS — Z79899 Other long term (current) drug therapy: Secondary | ICD-10-CM | POA: Diagnosis not present

## 2015-06-03 DIAGNOSIS — N200 Calculus of kidney: Secondary | ICD-10-CM | POA: Diagnosis not present

## 2015-06-03 DIAGNOSIS — Z8639 Personal history of other endocrine, nutritional and metabolic disease: Secondary | ICD-10-CM | POA: Diagnosis not present

## 2015-06-03 DIAGNOSIS — Z87891 Personal history of nicotine dependence: Secondary | ICD-10-CM | POA: Diagnosis not present

## 2015-06-03 DIAGNOSIS — R1031 Right lower quadrant pain: Secondary | ICD-10-CM | POA: Diagnosis present

## 2015-06-03 HISTORY — DX: Ventricular premature depolarization: I49.3

## 2015-06-03 LAB — CBC
HCT: 45.9 % (ref 36.0–46.0)
Hemoglobin: 15.2 g/dL — ABNORMAL HIGH (ref 12.0–15.0)
MCH: 30.5 pg (ref 26.0–34.0)
MCHC: 33.1 g/dL (ref 30.0–36.0)
MCV: 92.2 fL (ref 78.0–100.0)
PLATELETS: 291 10*3/uL (ref 150–400)
RBC: 4.98 MIL/uL (ref 3.87–5.11)
RDW: 13.1 % (ref 11.5–15.5)
WBC: 9.8 10*3/uL (ref 4.0–10.5)

## 2015-06-03 LAB — COMPREHENSIVE METABOLIC PANEL
ALBUMIN: 4.2 g/dL (ref 3.5–5.0)
ALT: 32 U/L (ref 14–54)
AST: 38 U/L (ref 15–41)
Alkaline Phosphatase: 85 U/L (ref 38–126)
Anion gap: 9 (ref 5–15)
BILIRUBIN TOTAL: 0.8 mg/dL (ref 0.3–1.2)
BUN: 13 mg/dL (ref 6–20)
CHLORIDE: 101 mmol/L (ref 101–111)
CO2: 26 mmol/L (ref 22–32)
CREATININE: 1.13 mg/dL — AB (ref 0.44–1.00)
Calcium: 9.1 mg/dL (ref 8.9–10.3)
GFR calc Af Amer: >60 mL/min (ref >60)
GFR, EST NON AFRICAN AMERICAN: 55 mL/min — AB (ref >60)
GLUCOSE: 110 mg/dL — AB (ref 65–99)
POTASSIUM: 4 mmol/L (ref 3.5–5.1)
Sodium: 136 mmol/L (ref 135–145)
TOTAL PROTEIN: 7.3 g/dL (ref 6.5–8.1)

## 2015-06-03 LAB — URINALYSIS, ROUTINE W REFLEX MICROSCOPIC
Bilirubin Urine: NEGATIVE
GLUCOSE, UA: NEGATIVE mg/dL
Ketones, ur: 15 mg/dL — AB
Nitrite: NEGATIVE
PH: 5.5 (ref 5.0–8.0)
PROTEIN: NEGATIVE mg/dL
Specific Gravity, Urine: 1.022 (ref 1.005–1.030)
Urobilinogen, UA: 1 mg/dL (ref 0.0–1.0)

## 2015-06-03 LAB — URINE MICROSCOPIC-ADD ON

## 2015-06-03 LAB — LIPASE, BLOOD: Lipase: 33 U/L (ref 11–51)

## 2015-06-03 MED ORDER — TAMSULOSIN HCL 0.4 MG PO CAPS: 0.4000 mg | ORAL_CAPSULE | Freq: Every day | ORAL | Status: DC

## 2015-06-03 MED ORDER — ONDANSETRON HCL 4 MG/2ML IJ SOLN
4.0000 mg | Freq: Once | INTRAMUSCULAR | Status: AC
  Administered 2015-06-03: 4 mg via INTRAVENOUS
  Filled 2015-06-03: qty 2

## 2015-06-03 MED ORDER — OXYCODONE-ACETAMINOPHEN 5-325 MG PO TABS
1.0000 | ORAL_TABLET | Freq: Once | ORAL | Status: AC
  Administered 2015-06-03: 1 via ORAL
  Filled 2015-06-03: qty 1

## 2015-06-03 MED ORDER — IOHEXOL 300 MG/ML  SOLN
100.0000 mL | Freq: Once | INTRAMUSCULAR | Status: AC | PRN
  Administered 2015-06-03: 100 mL via INTRAVENOUS

## 2015-06-03 MED ORDER — KETOROLAC TROMETHAMINE 15 MG/ML IJ SOLN
15.0000 mg | Freq: Once | INTRAMUSCULAR | Status: AC
  Administered 2015-06-03: 15 mg via INTRAVENOUS
  Filled 2015-06-03: qty 1

## 2015-06-03 MED ORDER — SODIUM CHLORIDE 0.9 % IV BOLUS (SEPSIS)
1000.0000 mL | Freq: Once | INTRAVENOUS | Status: AC
  Administered 2015-06-03: 1000 mL via INTRAVENOUS

## 2015-06-03 MED ORDER — OXYCODONE-ACETAMINOPHEN 5-325 MG PO TABS: 1.0000 | ORAL_TABLET | Freq: Four times a day (QID) | ORAL | Status: DC | PRN

## 2015-06-03 MED ORDER — IOHEXOL 300 MG/ML  SOLN
25.0000 mL | Freq: Once | INTRAMUSCULAR | Status: AC | PRN
  Administered 2015-06-03: 25 mL via ORAL

## 2015-06-03 MED ORDER — MORPHINE SULFATE (PF) 2 MG/ML IV SOLN
2.0000 mg | Freq: Once | INTRAVENOUS | Status: AC
  Administered 2015-06-03: 2 mg via INTRAVENOUS
  Filled 2015-06-03: qty 1

## 2015-06-03 MED ORDER — TAMSULOSIN HCL 0.4 MG PO CAPS
0.4000 mg | ORAL_CAPSULE | Freq: Once | ORAL | Status: AC
  Administered 2015-06-03: 0.4 mg via ORAL
  Filled 2015-06-03: qty 1

## 2015-06-03 NOTE — ED Provider Notes (Signed)
CSN: [id number]     Arrival date & time 06/03/15  1255 History   First MD Initiated Contact with Patient 06/03/15 1309     Chief Complaint  Patient presents with  . Abdominal Pain  . Emesis   HPI   49 year old female presents today with right lower quadrant abdominal pain. Patient reports symptoms started this morning abruptly, describes it as sharp stabbing pain in the right lower quadrant, worse with palpation, worse with movement. She reports several episodes of emesis with associated nausea. Patient reports a history of kidney stones but reports this is more severe, not located in the usual flank. Patient denies any exposure to abnormal food or drink, denies any diarrhea, normal bowel movement this morning. Patient denies any difficulty with urination, blood in urine.  Past Medical History  Diagnosis Date  . Degeneration of cervical intervertebral disc   . Mitral valve disorders   . Personal history of urinary calculi   . Hyperlipidemia   . Frequent PVCs    Past Surgical History  Procedure Laterality Date  . Abdominal hysterectomy    . Cholecystectomy    . Tonsillectomy    . Tonsillectomy     Family History  Problem Relation Age of Onset  . Breast cancer Mother   . Bipolar disorder Father   . Coronary artery disease Paternal Aunt   . Diabetes Neg Hx   . Cancer Neg Hx    Social History  Substance Use Topics  . Smoking status: Former Smoker    Types: Cigarettes    Quit date: 08/05/1978  . Smokeless tobacco: Never Used  . Alcohol Use: Yes   OB History    No data available     Review of Systems  All other systems reviewed and are negative.   Allergies  Codeine  Home Medications   Prior to Admission medications   Medication Sig Start Date End Date Taking? Authorizing Provider  ALPRAZolam Prudy Feeler) 0.25 MG tablet TAKE 1 TABLET BY MOUTH TWICE A DAY AS NEEDED 04/07/14  Yes Karie Schwalbe, MD  estradiol (ESTRACE) 0.5 MG tablet Take 0.5 mg by mouth daily.   Yes  Karie Schwalbe, MD  traMADol (ULTRAM) 50 MG tablet TAKE 1 TABLET BY MOUTH EVERY 4 HOURS AS NEEDED FOR PAIN 03/20/15  Yes Karie Schwalbe, MD  oxyCODONE-acetaminophen (PERCOCET/ROXICET) 5-325 MG tablet Take 1 tablet by mouth every 6 (six) hours as needed for severe pain. 06/03/15   Eyvonne Mechanic, PA-C  tamsulosin (FLOMAX) 0.4 MG CAPS capsule Take 1 capsule (0.4 mg total) by mouth daily. 06/03/15   Ronneisha Jett, PA-C   BP 130/74 mmHg  Pulse 97  Temp(Src) 97.6 F (36.4 C) (Oral)  Resp 16  Ht  (1.651 m)  SpO2 98%    Physical Exam  Constitutional: She is oriented to person, place, and time. She appears well-developed and well-nourished.  HENT:  Head: Normocephalic and atraumatic.  Eyes: Conjunctivae are normal. Pupils are equal, round, and reactive to light. Right eye exhibits no discharge. Left eye exhibits no discharge. No scleral icterus.  Neck: Normal range of motion. No JVD present. No tracheal deviation present.  Pulmonary/Chest: Effort normal. No stridor.  Abdominal: There is tenderness in the right lower quadrant. There is guarding. There is no rebound and no CVA tenderness.  Neurological: She is alert and oriented to person, place, and time. Coordination normal.  Psychiatric: She has a normal mood and affect. Her behavior is normal. Judgment and thought content normal.  Nursing  note and vitals reviewed.     ED Course  Procedures (including critical care time) Labs Review Labs Reviewed  COMPREHENSIVE METABOLIC PANEL - Abnormal; Notable for the following:    Glucose, Bld 110 (*)    Creatinine, Ser 1.13 (*)    GFR calc non Af Amer 55 (*)    All other components within normal limits  CBC - Abnormal; Notable for the following:    Hemoglobin 15.2 (*)    All other components within normal limits  URINALYSIS, ROUTINE W REFLEX MICROSCOPIC (NOT AT [facility]) - Abnormal; Notable for the following:    APPearance CLOUDY (*)    Hgb urine dipstick LARGE (*)    Ketones, ur 15 (*)     Leukocytes, UA SMALL (*)    All other components within normal limits  URINE MICROSCOPIC-ADD ON - Abnormal; Notable for the following:    Crystals CA OXALATE CRYSTALS (*)    All other components within normal limits  LIPASE, BLOOD    Imaging Review Ct Abdomen Pelvis W Contrast  06/03/2015  CLINICAL DATA:  Right pelvic pain, nausea/vomiting, history of kidney stones. Prior cholecystectomy and hysterectomy. EXAM: CT ABDOMEN AND PELVIS WITH CONTRAST TECHNIQUE: Multidetector CT imaging of the abdomen and pelvis was performed using the standard protocol following bolus administration of intravenous contrast. CONTRAST:  OMNIPAQUE IOHEXOL 300 MG/ML  SOLN COMPARISON:  None. FINDINGS: Lower chest:  Lung bases are clear. Hepatobiliary: Liver is notable for tiny cysts measuring up to 5 mm (series 2/ image 15). Status post cholecystectomy. No intrahepatic ductal dilatation. Dilated common duct, measuring 11 mm (series 5/ image 33), but smoothly tapering at the ampulla, likely postsurgical. Pancreas: Within normal limits. Spleen: Within normal limits. Adrenals/Urinary Tract: Adrenal glands are within normal limits. Left kidney is within normal limits. 3 mm nonobstructing right upper pole renal calculus (coronal image 54). Mild perirenal edema with mildly diminished enhancement. Moderate right hydroureteronephrosis. Associated 4 mm distal right ureteral calculus at the UVJ (series 2/image 68). Bladder is unremarkable. Stomach/Bowel: Stomach is notable for a tiny hiatal hernia. No evidence of bowel obstruction. Normal appendix (series 2/image 59). Left colon is decompressed. Vascular/Lymphatic: No evidence of abdominal aortic aneurysm. No suspicious abdominopelvic lymphadenopathy. Reproductive: Status post hysterectomy. Left ovary is within normal limits. No right adnexal mass. Other: No abdominopelvic ascites. Musculoskeletal: Visualized osseous structures are within normal limits. IMPRESSION: 4 mm distal  right ureteral calculus at the UVJ. Moderate right hydroureteronephrosis. Additional 3 mm nonobstructing right upper pole renal calculus. Electronically Signed   By: Charline Bills M.D.   On: 06/03/2015 16:04   I have personally reviewed and evaluated these images and lab results as part of my medical decision-making.   EKG Interpretation None      MDM   Final diagnoses:  RLQ abdominal pain  Nephrolithiasis   Labs: Urinalysis, lipase, CMP, CBC- creatinine 1.13 up from 1.0   Imaging: CT abdomen and pelvis- see above  Consults:   Therapeutics: Morphine, Toradol  Discharge Meds: Percocet, Flomax  Assessment/Plan: Patient presentation most consistent with kidney stones. Patient has 4 mm stone at the UVJ, history of the same. Patient treated in the ED with improvement in symptoms with above medications. Patient will be discharged home with instructions to use medication as directed, monitor for passage of stone. If new or worsening signs or symptoms present she is encouraged to return to the ED for further evaluation and management. She is instructed to make contact with nephrologist for further evaluation and management. Patient  verbalized understanding and agreement for today's plan.         Eyvonne MechanicJeffrey Marilin Kofman, PA-C 06/04/15 1217  Leta BaptistEmily Roe Nguyen, MD 06/04/15 [phone]

## 2015-06-03 NOTE — Discharge Instructions (Signed)
Please medication as directed, please follow-up with nephrology for further evaluation and management. If new or worsening signs or symptoms present please return for further evaluation.

## 2015-06-03 NOTE — ED Notes (Signed)
Pt from home with sudden onset RLQ pain starting this am.  Pt denies fevers at home, reports emesis due to pain only no nausea.  Hx of kidney stones but this does not feel the same to her.  Pt still has her appendix.  Pain severe at this time, A&O.

## 2015-06-04 ENCOUNTER — Telehealth (HOSPITAL_BASED_OUTPATIENT_CLINIC_OR_DEPARTMENT_OTHER): Payer: Self-pay | Admitting: Emergency Medicine

## 2015-06-06 ENCOUNTER — Telehealth: Payer: Self-pay | Admitting: *Deleted

## 2015-06-06 NOTE — Telephone Encounter (Signed)
Spoke with patient after ED visit and she feels a lot better, she passed the kidney stones, and if she needs a urologist she will call back

## 2015-07-04 ENCOUNTER — Other Ambulatory Visit: Payer: Self-pay | Admitting: Internal Medicine

## 2015-07-21 ENCOUNTER — Other Ambulatory Visit: Payer: Self-pay

## 2015-07-21 ENCOUNTER — Other Ambulatory Visit: Payer: Self-pay | Admitting: Internal Medicine

## 2015-07-21 MED ORDER — TRAMADOL HCL 50 MG PO TABS: 50.0000 mg | ORAL_TABLET | Freq: Two times a day (BID) | ORAL | Status: DC | PRN

## 2015-07-21 NOTE — Telephone Encounter (Signed)
rx called into pharmacy Spoke with patient and advised results   

## 2015-07-21 NOTE — Telephone Encounter (Signed)
Sherri Rice, Please send back to me for call in  

## 2015-07-21 NOTE — Telephone Encounter (Signed)
Pt left v/m requesting refill on tramadol; last refilled # 90 on 03/20/15; last annual 10/29/13 and last acute visit 10/03/14. No future appt scheduled. Pt request cb.

## 2015-07-21 NOTE — Addendum Note (Signed)
Addended by: Roxy MannsWER, MARNE A on: 07/21/2015 04:52 PM   Modules accepted: Orders

## 2015-07-21 NOTE — Telephone Encounter (Signed)
Left message on machine that pt would need to schedule an appt before refill can be done. Last filled 03/20/2015

## 2015-07-21 NOTE — Addendum Note (Signed)
Addended by: Sueanne MargaritaSMITH, DESHANNON L on: 07/21/2015 03:08 PM   Modules accepted: Orders

## 2015-07-21 NOTE — Telephone Encounter (Signed)
Px written for call in   

## 2015-07-21 NOTE — Telephone Encounter (Signed)
Spoke with patient and she stated she was seen in 2016 for cpx, I advised not in 2016, last CPX was 2015, per pt she doesn't agree but will schedule another anyway. Pt scheduled appt on 08/27/2014, ok to give refill of tramadol?

## 2015-08-25 LAB — HM MAMMOGRAPHY: HM Mammogram: NORMAL (ref 0–4)

## 2015-08-28 ENCOUNTER — Encounter: Payer: Self-pay | Admitting: Internal Medicine

## 2015-08-28 ENCOUNTER — Ambulatory Visit (INDEPENDENT_AMBULATORY_CARE_PROVIDER_SITE_OTHER): Payer: BLUE CROSS/BLUE SHIELD | Admitting: Internal Medicine

## 2015-08-28 VITALS — BP 120/70 | HR 80 | Temp 97.5°F | Ht 64.5 in | Wt 170.0 lb

## 2015-08-28 DIAGNOSIS — Z0000 Encounter for general adult medical examination without abnormal findings: Secondary | ICD-10-CM

## 2015-08-28 DIAGNOSIS — R319 Hematuria, unspecified: Secondary | ICD-10-CM

## 2015-08-28 DIAGNOSIS — E785 Hyperlipidemia, unspecified: Secondary | ICD-10-CM | POA: Diagnosis not present

## 2015-08-28 DIAGNOSIS — Z1211 Encounter for screening for malignant neoplasm of colon: Secondary | ICD-10-CM | POA: Diagnosis not present

## 2015-08-28 DIAGNOSIS — Z23 Encounter for immunization: Secondary | ICD-10-CM | POA: Diagnosis not present

## 2015-08-28 DIAGNOSIS — N390 Urinary tract infection, site not specified: Secondary | ICD-10-CM

## 2015-08-28 DIAGNOSIS — R21 Rash and other nonspecific skin eruption: Secondary | ICD-10-CM

## 2015-08-28 MED ORDER — SULFAMETHOXAZOLE-TRIMETHOPRIM 800-160 MG PO TABS: 1.0000 | ORAL_TABLET | Freq: Two times a day (BID) | ORAL | Status: DC

## 2015-08-28 MED ORDER — TRIAMCINOLONE ACETONIDE 0.1 % EX CREA
1.0000 | TOPICAL_CREAM | Freq: Two times a day (BID) | CUTANEOUS | Status: DC | PRN
Start: 2015-08-28 — End: 2016-10-29

## 2015-08-28 MED ORDER — FLUTICASONE PROPIONATE 50 MCG/ACT NA SUSP: 2.0000 | Freq: Every day | NASAL | Status: DC

## 2015-08-28 NOTE — Assessment & Plan Note (Signed)
Try TAC To derm if persists

## 2015-08-28 NOTE — Progress Notes (Signed)
Pre visit review using our clinic review tool, if applicable. No additional management support is needed unless otherwise documented below in the visit note. 

## 2015-08-28 NOTE — Assessment & Plan Note (Addendum)
Healthy but frustrated by weight gain Discussed fitness Will do fecal immunoassay Overdue for mammogram--she will set up Will try to wean estrogen to lowest effective dose

## 2015-08-28 NOTE — Addendum Note (Signed)
Addended by: Sueanne Margarita on: 08/28/2015 05:26 PM   Modules accepted: Orders

## 2015-08-28 NOTE — Assessment & Plan Note (Addendum)
May be mostly post coital Will finish Rx Then take 1 post coital Planning to establish with urology since she had kidney stone

## 2015-08-28 NOTE — Progress Notes (Signed)
Subjective:    Patient ID: Sherri Rice, female    DOB: 04-03-1966, 50 y.o.   MRN: [id number]  HPI Here for physical  Doing okay Did have another urinary infection--got 3 days of bactrim at [facility]  Had hematuria and right flank pain Culture sent but no results yet Had UTI 11/16, kidney stones 10/16 (stone caught but she doesn't know what type it was)  Complete hysterectomy and BSO in 30's Now having some "weird symptoms" that she worries is menopausal Atkin's diet now and exercising regularly--but still gaining weight Rash on chest--acne (and on back)  Frustrated by past year--pneumonia, TM rupture Still with some ringing  Current Outpatient Prescriptions on File Prior to Visit  Medication Sig Dispense Refill  . ALPRAZolam (XANAX) 0.25 MG tablet TAKE 1 TABLET BY MOUTH TWICE A DAY AS NEEDED 30 tablet 0  . estradiol (ESTRACE) 0.5 MG tablet TAKE 1 TABLET BY MOUTH ONCE DAILY. PLEASE MAKE APPT WITH MD 90 tablet 0  . traMADol (ULTRAM) 50 MG tablet Take 1 tablet (50 mg total) by mouth 2 (two) times daily as needed. 60 tablet 0   No current facility-administered medications on file prior to visit.    Allergies  Allergen Reactions  . Codeine     REACTION: itching    Past Medical History  Diagnosis Date  . Degeneration of cervical intervertebral disc   . Mitral valve disorders   . Personal history of urinary calculi   . Hyperlipidemia   . Frequent PVCs     Past Surgical History  Procedure Laterality Date  . Abdominal hysterectomy    . Cholecystectomy    . Tonsillectomy    . Tonsillectomy      Family History  Problem Relation Age of Onset  . Breast cancer Mother   . Bipolar disorder Father   . Coronary artery disease Paternal Aunt   . Diabetes Neg Hx   . Cancer Neg Hx     Social History   Social History  . Marital Status: Married    Spouse Name: N/A  . Number of Children: 2  . Years of Education: N/A   Occupational History  . [employer]       Radiology PA   Social History Main Topics  . Smoking status: Former Smoker    Types: Cigarettes    Quit date: 08/05/1978  . Smokeless tobacco: Never Used  . Alcohol Use: Yes  . Drug Use: No  . Sexual Activity: Not on file   Other Topics Concern  . Not on file   Social History Narrative   Review of Systems  Constitutional: Negative for fatigue and unexpected weight change.       Wears seat belt  HENT: Positive for tinnitus. Negative for hearing loss.        Ringing goes louder with barometric pressure changes Teeth okay--regular with dentist  Eyes: Negative for visual disturbance.       No diplopia or unilateral vision loss  Respiratory: Negative for cough, chest tightness and shortness of breath.   Cardiovascular: Positive for chest pain and palpitations. Negative for leg swelling.       Had bout of frequent PVCs and was in ICU overnight in [place] Stress test okay  Gastrointestinal: Negative for nausea, vomiting, abdominal pain, constipation and blood in stool.       No heartburn   Endocrine: Negative for polydipsia and polyuria.  Genitourinary: Positive for dysuria. Negative for dyspareunia.  Musculoskeletal: Negative for back pain, joint  swelling and arthralgias.  Skin: Positive for rash.       No suspicious lesions  Allergic/Immunologic: Negative for environmental allergies and immunocompromised state.  Neurological: Positive for headaches. Negative for dizziness, syncope, weakness and light-headedness.  Hematological: Negative for adenopathy. Does not bruise/bleed easily.  Psychiatric/Behavioral: Negative for sleep disturbance and dysphoric mood. The patient is not nervous/anxious.        Objective:   Physical Exam  Constitutional: She is oriented to person, place, and time. She appears well-developed and well-nourished. No distress.  HENT:  Head: Normocephalic and atraumatic.  Right Ear: External ear normal.  Left Ear: External ear normal.  Mouth/Throat:  Oropharynx is clear and moist. No oropharyngeal exudate.  Eyes: Conjunctivae are normal. Pupils are equal, round, and reactive to light.  Neck: Normal range of motion. Neck supple. No thyromegaly present.  Cardiovascular: Normal rate, regular rhythm, normal heart sounds and intact distal pulses.  Exam reveals no gallop.   No murmur heard. Pulmonary/Chest: Effort normal and breath sounds normal. No respiratory distress. She has no wheezes. She has no rales.  Abdominal: Soft. There is no tenderness.  Musculoskeletal: She exhibits no edema or tenderness.  Lymphadenopathy:    She has no cervical adenopathy.  Neurological: She is alert and oriented to person, place, and time.  Skin:  Red macular rash on back and chest---clearly not infectious  Psychiatric: She has a normal mood and affect. Her behavior is normal.          Assessment & Plan:

## 2015-08-28 NOTE — Assessment & Plan Note (Signed)
Mild and low risk profile No Rx

## 2015-08-28 NOTE — Patient Instructions (Addendum)
Please set up your screening mammogram.  DASH Eating Plan DASH stands for "Dietary Approaches to Stop Hypertension." The DASH eating plan is a healthy eating plan that has been shown to reduce high blood pressure (hypertension). Additional health benefits may include reducing the risk of type 2 diabetes mellitus, heart disease, and stroke. The DASH eating plan may also help with weight loss. WHAT DO I NEED TO KNOW ABOUT THE DASH EATING PLAN? For the DASH eating plan, you will follow these general guidelines:  Choose foods with a percent daily value for sodium of less than 5% (as listed on the food label).  Use salt-free seasonings or herbs instead of table salt or sea salt.  Check with your health care provider or pharmacist before using salt substitutes.  Eat lower-sodium products, often labeled as "lower sodium" or "no salt added."  Eat fresh foods.  Eat more vegetables, fruits, and low-fat dairy products.  Choose whole grains. Look for the word "whole" as the first word in the ingredient list.  Choose fish and skinless chicken or turkey more often than red meat. Limit fish, poultry, and meat to 6 oz (170 g) each day.  Limit sweets, desserts, sugars, and sugary drinks.  Choose heart-healthy fats.  Limit cheese to 1 oz (28 g) per day.  Eat more home-cooked food and less restaurant, buffet, and fast food.  Limit fried foods.  Cook foods using methods other than frying.  Limit canned vegetables. If you do use them, rinse them well to decrease the sodium.  When eating at a restaurant, ask that your food be prepared with less salt, or no salt if possible. WHAT FOODS CAN I EAT? Seek help from a dietitian for individual calorie needs. Grains Whole grain or whole wheat bread. Brown rice. Whole grain or whole wheat pasta. Quinoa, bulgur, and whole grain cereals. Low-sodium cereals. Corn or whole wheat flour tortillas. Whole grain cornbread. Whole grain crackers. Low-sodium  crackers. Vegetables Fresh or frozen vegetables (raw, steamed, roasted, or grilled). Low-sodium or reduced-sodium tomato and vegetable juices. Low-sodium or reduced-sodium tomato sauce and paste. Low-sodium or reduced-sodium canned vegetables.  Fruits All fresh, canned (in natural juice), or frozen fruits. Meat and Other Protein Products Ground beef (85% or leaner), grass-fed beef, or beef trimmed of fat. Skinless chicken or turkey. Ground chicken or turkey. Pork trimmed of fat. All fish and seafood. Eggs. Dried beans, peas, or lentils. Unsalted nuts and seeds. Unsalted canned beans. Dairy Low-fat dairy products, such as skim or 1% milk, 2% or reduced-fat cheeses, low-fat ricotta or cottage cheese, or plain low-fat yogurt. Low-sodium or reduced-sodium cheeses. Fats and Oils Tub margarines without trans fats. Light or reduced-fat mayonnaise and salad dressings (reduced sodium). Avocado. Safflower, olive, or canola oils. Natural peanut or almond butter. Other Unsalted popcorn and pretzels. The items listed above may not be a complete list of recommended foods or beverages. Contact your dietitian for more options. WHAT FOODS ARE NOT RECOMMENDED? Grains White bread. White pasta. White rice. Refined cornbread. Bagels and croissants. Crackers that contain trans fat. Vegetables Creamed or fried vegetables. Vegetables in a cheese sauce. Regular canned vegetables. Regular canned tomato sauce and paste. Regular tomato and vegetable juices. Fruits Dried fruits. Canned fruit in light or heavy syrup. Fruit juice. Meat and Other Protein Products Fatty cuts of meat. Ribs, chicken wings, bacon, sausage, bologna, salami, chitterlings, fatback, hot dogs, bratwurst, and packaged luncheon meats. Salted nuts and seeds. Canned beans with salt. Dairy Whole or 2% milk, cream, half-and-half, and   cream cheese. Whole-fat or sweetened yogurt. Full-fat cheeses or blue cheese. Nondairy creamers and whipped toppings.  Processed cheese, cheese spreads, or cheese curds. Condiments Onion and garlic salt, seasoned salt, table salt, and sea salt. Canned and packaged gravies. Worcestershire sauce. Tartar sauce. Barbecue sauce. Teriyaki sauce. Soy sauce, including reduced sodium. Steak sauce. Fish sauce. Oyster sauce. Cocktail sauce. Horseradish. Ketchup and mustard. Meat flavorings and tenderizers. Bouillon cubes. Hot sauce. Tabasco sauce. Marinades. Taco seasonings. Relishes. Fats and Oils Butter, stick margarine, lard, shortening, ghee, and bacon fat. Coconut, palm kernel, or palm oils. Regular salad dressings. Other Pickles and olives. Salted popcorn and pretzels. The items listed above may not be a complete list of foods and beverages to avoid. Contact your dietitian for more information. WHERE CAN I FIND MORE INFORMATION? National Heart, Lung, and Blood Institute: www.nhlbi.nih.gov/health/health-topics/topics/dash/   This information is not intended to replace advice given to you by your health care provider. Make sure you discuss any questions you have with your health care provider.   Document Released: 07/11/2011 Document Revised: 08/12/2014 Document Reviewed: 05/26/2013 Elsevier Interactive Patient Education 2016 Elsevier Inc.  

## 2015-09-13 ENCOUNTER — Other Ambulatory Visit: Payer: Self-pay

## 2015-09-13 NOTE — Telephone Encounter (Signed)
Pt is presently [place] condo [place] and request refill tramadol(last refilled # 60 on 07/21/15) to [facility]. Last annual exam on 08/28/15. Pt still lives in [place] but has condo in [place] as well.pt request cb when refilled.

## 2015-09-14 MED ORDER — TRAMADOL HCL 50 MG PO TABS: 50.0000 mg | ORAL_TABLET | Freq: Two times a day (BID) | ORAL | Status: DC | PRN

## 2015-09-14 NOTE — Telephone Encounter (Signed)
rx called into pharmacy

## 2015-09-14 NOTE — Telephone Encounter (Signed)
Approved: #60 x 0 

## 2015-10-02 ENCOUNTER — Other Ambulatory Visit: Payer: Self-pay | Admitting: Internal Medicine

## 2015-10-20 ENCOUNTER — Other Ambulatory Visit: Payer: Self-pay | Admitting: Internal Medicine

## 2015-10-20 NOTE — Telephone Encounter (Signed)
Last refill 04-07-14 #30/0. Last OV 08-28-15

## 2015-10-20 NOTE — Telephone Encounter (Signed)
Left refill on voice mail at pharmacy  

## 2015-11-02 ENCOUNTER — Other Ambulatory Visit: Payer: Self-pay | Admitting: Internal Medicine

## 2015-11-02 NOTE — Telephone Encounter (Signed)
Last refill 09-14-15 #60/0 Last OV 08-28-15. No future OV scheduled

## 2015-11-02 NOTE — Telephone Encounter (Signed)
Approved: #60 x 0 

## 2015-11-03 NOTE — Telephone Encounter (Signed)
Left refill on voice mail at pharmacy  

## 2015-12-21 ENCOUNTER — Other Ambulatory Visit: Payer: Self-pay | Admitting: Internal Medicine

## 2015-12-21 NOTE — Telephone Encounter (Signed)
Approved: #60 x 0 

## 2015-12-21 NOTE — Telephone Encounter (Signed)
Pt had CPE on 08/28/15, last refilled on 11/03/15 #60 with 0 refills, please advise

## 2015-12-22 NOTE — Telephone Encounter (Signed)
Left refill on voice mail at pharmacy  

## 2015-12-22 NOTE — Telephone Encounter (Signed)
Tried to call in medication on voice mail but they did not have it on. I will call and give verbal refill

## 2015-12-23 ENCOUNTER — Other Ambulatory Visit: Payer: Self-pay | Admitting: Internal Medicine

## 2015-12-25 NOTE — Telephone Encounter (Signed)
Approved: #20 x 1 1 after sex as directed See if she has reestablished with urologist

## 2015-12-25 NOTE — Telephone Encounter (Signed)
Tried to call patient. Left message to call office

## 2015-12-25 NOTE — Telephone Encounter (Signed)
Last written 08-28-15 #20/1. Last Filled 11-22-15 Last OV 08-28-15 No future OV

## 2015-12-26 NOTE — Telephone Encounter (Signed)
If she is doing well and no stones, I guess she can hold off on urologist

## 2015-12-26 NOTE — Telephone Encounter (Signed)
Spoke to pt. She said she did not set up with the urologist because the regimen for Bactrim you came up with has been working and she did not think she needed to see the urologist. Rx sent electronically.

## 2016-02-08 ENCOUNTER — Other Ambulatory Visit: Payer: Self-pay | Admitting: Internal Medicine

## 2016-02-08 NOTE — Telephone Encounter (Signed)
Approved: #60 x 0 She will be due for physical in January or February---she may want to set it up soon

## 2016-02-08 NOTE — Telephone Encounter (Signed)
Last filled 12-22-15 #60 Last OV 08-28-15 No Future OV

## 2016-02-08 NOTE — Telephone Encounter (Signed)
Left refill on voice mail at pharmacy Can you set the pt up for a CPE in January or February? Thanks

## 2016-02-09 NOTE — Telephone Encounter (Signed)
cpx 2/21 Pt aware

## 2016-04-11 ENCOUNTER — Other Ambulatory Visit: Payer: Self-pay | Admitting: Internal Medicine

## 2016-04-11 NOTE — Telephone Encounter (Signed)
Approved: #60 x 0 

## 2016-04-11 NOTE — Telephone Encounter (Signed)
Last Filled 02-08-16 #60 Last OV 08-28-15 Next OV 09-25-16

## 2016-04-11 NOTE — Telephone Encounter (Signed)
Left refill on voice mail at pharmacy  

## 2016-06-03 ENCOUNTER — Other Ambulatory Visit: Payer: Self-pay | Admitting: Internal Medicine

## 2016-06-11 ENCOUNTER — Other Ambulatory Visit: Payer: Self-pay | Admitting: Internal Medicine

## 2016-06-11 NOTE — Telephone Encounter (Signed)
Last filled 04-11-16 #60 Last OV 08-28-15 Next OV 09-25-16

## 2016-06-12 NOTE — Telephone Encounter (Signed)
Approved: #60 x 0 

## 2016-06-12 NOTE — Telephone Encounter (Signed)
Left refill on voice mail at pharmacy  

## 2016-08-09 ENCOUNTER — Other Ambulatory Visit: Payer: Self-pay | Admitting: Internal Medicine

## 2016-08-09 NOTE — Telephone Encounter (Signed)
Left refill on voice mail at pharmacy  

## 2016-08-09 NOTE — Telephone Encounter (Signed)
Last filled 06-12-16 #60 Last OV 08-28-15 Next OV 09-25-16  Forward to Dr Ermalene SearingBedsole in Dr Karle StarchLetvak's absence

## 2016-09-25 ENCOUNTER — Encounter: Payer: BLUE CROSS/BLUE SHIELD | Admitting: Internal Medicine

## 2016-10-04 ENCOUNTER — Other Ambulatory Visit: Payer: Self-pay | Admitting: Internal Medicine

## 2016-10-04 NOTE — Telephone Encounter (Signed)
Last filled 08/09/16 #60. Last ov 08/2015. Pt cancelled 09/2016 appt

## 2016-10-04 NOTE — Telephone Encounter (Signed)
Pharmacy line busy.

## 2016-10-04 NOTE — Telephone Encounter (Signed)
Approved: #60 x 0 Please have her reschedule her PE in the next couple of months

## 2016-10-07 NOTE — Telephone Encounter (Signed)
Rx called in to requested pharmacy. LM on pts vm advised OV required within next couple of months per Dr Alphonsus SiasLetvak

## 2016-10-29 ENCOUNTER — Encounter: Payer: Self-pay | Admitting: Internal Medicine

## 2016-10-29 ENCOUNTER — Ambulatory Visit (INDEPENDENT_AMBULATORY_CARE_PROVIDER_SITE_OTHER): Payer: BC Managed Care – PPO | Admitting: Internal Medicine

## 2016-10-29 VITALS — BP 110/70 | HR 90 | Temp 98.2°F | Ht 64.5 in | Wt 180.0 lb

## 2016-10-29 DIAGNOSIS — R5383 Other fatigue: Secondary | ICD-10-CM | POA: Diagnosis not present

## 2016-10-29 DIAGNOSIS — Z0000 Encounter for general adult medical examination without abnormal findings: Secondary | ICD-10-CM | POA: Diagnosis not present

## 2016-10-29 DIAGNOSIS — Z1211 Encounter for screening for malignant neoplasm of colon: Secondary | ICD-10-CM | POA: Diagnosis not present

## 2016-10-29 DIAGNOSIS — E785 Hyperlipidemia, unspecified: Secondary | ICD-10-CM | POA: Diagnosis not present

## 2016-10-29 LAB — LIPID PANEL
CHOLESTEROL: 241 mg/dL — AB (ref 0–200)
HDL: 69.1 mg/dL (ref >39.00)
LDL CALC: 156 mg/dL — AB (ref 0–99)
NonHDL: 171.94
TRIGLYCERIDES: 78 mg/dL (ref 0.0–149.0)
Total CHOL/HDL Ratio: 3
VLDL: 15.6 mg/dL (ref 0.0–40.0)

## 2016-10-29 LAB — CBC WITH DIFFERENTIAL/PLATELET
BASOS ABS: 0.1 10*3/uL (ref 0.0–0.1)
Basophils Relative: 1 % (ref 0.0–3.0)
EOS ABS: 0.1 10*3/uL (ref 0.0–0.7)
Eosinophils Relative: 2.5 % (ref 0.0–5.0)
HCT: 41.8 % (ref 36.0–46.0)
Hemoglobin: 14.1 g/dL (ref 12.0–15.0)
Lymphocytes Relative: 34.5 % (ref 12.0–46.0)
Lymphs Abs: 1.9 10*3/uL (ref 0.7–4.0)
MCHC: 33.7 g/dL (ref 30.0–36.0)
MCV: 91.3 fl (ref 78.0–100.0)
MONO ABS: 0.4 10*3/uL (ref 0.1–1.0)
Monocytes Relative: 7.9 % (ref 3.0–12.0)
NEUTROS ABS: 3 10*3/uL (ref 1.4–7.7)
NEUTROS PCT: 54.1 % (ref 43.0–77.0)
PLATELETS: 287 10*3/uL (ref 150.0–400.0)
RBC: 4.58 Mil/uL (ref 3.87–5.11)
RDW: 13.5 % (ref 11.5–15.5)
WBC: 5.5 10*3/uL (ref 4.0–10.5)

## 2016-10-29 LAB — COMPREHENSIVE METABOLIC PANEL
ALBUMIN: 4.2 g/dL (ref 3.5–5.2)
ALT: 35 U/L (ref 0–35)
AST: 37 U/L (ref 0–37)
Alkaline Phosphatase: 146 U/L — ABNORMAL HIGH (ref 39–117)
BILIRUBIN TOTAL: 0.4 mg/dL (ref 0.2–1.2)
BUN: 12 mg/dL (ref 6–23)
CO2: 29 meq/L (ref 19–32)
CREATININE: 1.12 mg/dL (ref 0.40–1.20)
Calcium: 9.7 mg/dL (ref 8.4–10.5)
Chloride: 103 mEq/L (ref 96–112)
GFR: 54.12 mL/min — ABNORMAL LOW (ref >60.00)
Glucose, Bld: 94 mg/dL (ref 70–99)
Potassium: 4.5 mEq/L (ref 3.5–5.1)
Sodium: 139 mEq/L (ref 135–145)
Total Protein: 7.1 g/dL (ref 6.0–8.3)

## 2016-10-29 LAB — T4, FREE: FREE T4: 0.82 ng/dL (ref 0.60–1.60)

## 2016-10-29 LAB — TSH: TSH: 2.62 u[IU]/mL (ref 0.35–4.50)

## 2016-10-29 MED ORDER — TRIAMCINOLONE ACETONIDE 0.1 % EX CREA: 1.0000 "application " | TOPICAL_CREAM | Freq: Two times a day (BID) | CUTANEOUS | 2 refills | Status: DC | PRN

## 2016-10-29 NOTE — Progress Notes (Signed)
Pre visit review using our clinic review tool, if applicable. No additional management support is needed unless otherwise documented below in the visit note. 

## 2016-10-29 NOTE — Progress Notes (Signed)
Subjective:    Patient ID: Sherri Rice, female    DOB: [birth year], 51 y.o.   MRN: [id number]  HPI Here for physical  Has been tired for months Sleeps 10 hours Eyebrows fell out Doesn't feel bad-- "just tired" Doesn't feel refreshed No sig pain Exercising more now--walking, stair master Some sweats and mild flashes No emotional instability Wonders about menopause No breast discharge  Current Outpatient Prescriptions on File Prior to Visit  Medication Sig Dispense Refill  . ALPRAZolam (XANAX) 0.25 MG tablet TAKE 1 TABLET BY MOUTH TWICE A DAY AS NEEDED 30 tablet 0  . estradiol (ESTRACE) 0.5 MG tablet TAKE 1 TABLET BY MOUTH ONCE DAILY. PLEASE MAKE APPT WITH MD 90 tablet 0  . fluticasone (FLONASE) 50 MCG/ACT nasal spray Place 2 sprays into both nostrils daily. In each nostril 16 g 12  . sulfamethoxazole-trimethoprim (BACTRIM DS,SEPTRA DS) 800-160 MG tablet TAKE 1 TABLET AFTER INTERCOURSE AS DIRECTED 20 tablet 1  . traMADol (ULTRAM) 50 MG tablet TAKE 1 TABLET BY MOUTH TWICE A DAY AS NEEDED 60 tablet 0  . triamcinolone cream (KENALOG) 0.1 % Apply 1 application topically 2 (two) times daily as needed. 30 g 0   No current facility-administered medications on file prior to visit.     Allergies  Allergen Reactions  . Codeine     REACTION: itching    Past Medical History:  Diagnosis Date  . Degeneration of cervical intervertebral disc   . Frequent PVCs   . Hyperlipidemia   . Mitral valve disorders(424.0)   . Personal history of urinary calculi     Past Surgical History:  Procedure Laterality Date  . ABDOMINAL HYSTERECTOMY    . CARDIOVASCULAR STRESS TEST  6/16   with contrast---was normal  . CHOLECYSTECTOMY    . TONSILLECTOMY    . TONSILLECTOMY      Family History  Problem Relation Age of Onset  . Breast cancer Mother   . Bipolar disorder Father   . Coronary artery disease Paternal Aunt   . Diabetes Neg Hx   . Cancer Neg Hx     Social History   Social History    . Marital status: Married    Spouse name: N/A  . Number of children: 2  . Years of education: N/A   Occupational History  . 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 Smoking status: Former Smoker    Types: Cigarettes    Quit date: 08/05/1978  . Smokeless tobacco: Never Used  . Alcohol use Yes  . Drug use: No  . Sexual activity: Not on file   Other Topics Concern  . Not on file   Social History Narrative  . No narrative on file   Review of Systems  Constitutional:       Gained 10# Wears seat belt  HENT: Positive for hearing loss and tinnitus. Negative for dental problem and trouble swallowing.        Keeps up with dentist  Eyes: Negative for visual disturbance.       No diplopia or unilateral vision loss  Respiratory: Negative for cough, chest tightness and shortness of breath.   Cardiovascular: Negative for chest pain, palpitations and leg swelling.  Gastrointestinal: Negative for blood in stool, constipation and nausea.       Mild heartburn-- OTC rantidine rarely  Endocrine: Negative for polydipsia and polyuria.  Genitourinary: Negative for dyspareunia, dysuria and hematuria.       Uses  the antibiotic after sex--has prevented UTIs  Musculoskeletal: Positive for arthralgias. Negative for back pain and joint swelling.  Skin: Negative for rash.  Allergic/Immunologic: Positive for environmental allergies. Negative for immunocompromised state.       Uses OTC prn  Neurological: Positive for dizziness and headaches. Negative for syncope and light-headedness.  Hematological: Negative for adenopathy. Does not bruise/bleed easily.  Psychiatric/Behavioral: Negative for dysphoric mood and sleep disturbance. The patient is not nervous/anxious.        Objective:   Physical Exam  Constitutional: She is oriented to person, place, and time. She appears well-developed and well-nourished. No distress.  HENT:  Head: Normocephalic and atraumatic.  Right Ear:  External ear normal.  Left Ear: External ear normal.  Mouth/Throat: Oropharynx is clear and moist. No oropharyngeal exudate.  Eyes: Conjunctivae are normal. Pupils are equal, round, and reactive to light.  Neck: Normal range of motion. Neck supple. No thyromegaly present.  Cardiovascular: Normal rate, regular rhythm, normal heart sounds and intact distal pulses.  Exam reveals no gallop.   No murmur heard. Pulmonary/Chest: Effort normal and breath sounds normal. No respiratory distress. She has no wheezes. She has no rales.  Abdominal: Soft. There is no tenderness.  Musculoskeletal: She exhibits no edema or tenderness.  Lymphadenopathy:    She has no cervical adenopathy.  Neurological: She is alert and oriented to person, place, and time.  Skin: No rash noted. No erythema.  Psychiatric: She has a normal mood and affect. Her behavior is normal.          Assessment & Plan:

## 2016-10-29 NOTE — Assessment & Plan Note (Signed)
With weight gain and some hypersomnolence Has increased exercise--didn't help Will check labs Not depressed

## 2016-10-29 NOTE — Assessment & Plan Note (Signed)
Generally healthy but doesn't feel right Discussed options Working on lifestyle but weight up some mammo due 1/19 Will do FIT (didn't do it last year) No pap due to hyster

## 2016-10-29 NOTE — Patient Instructions (Signed)
DASH Eating Plan DASH stands for "Dietary Approaches to Stop Hypertension." The DASH eating plan is a healthy eating plan that has been shown to reduce high blood pressure (hypertension). It may also reduce your risk for type 2 diabetes, heart disease, and stroke. The DASH eating plan may also help with weight loss. What are tips for following this plan? General guidelines  Avoid eating more than 2,300 mg (milligrams) of salt (sodium) a day. If you have hypertension, you may need to reduce your sodium intake to 1,500 mg a day.  Limit alcohol intake to no more than 1 drink a day for nonpregnant women and 2 drinks a day for men. One drink equals 12 oz of beer, 5 oz of wine, or 1 oz of hard liquor.  Work with your health care provider to maintain a healthy body weight or to lose weight. Ask what an ideal weight is for you.  Get at least 30 minutes of exercise that causes your heart to beat faster (aerobic exercise) most days of the week. Activities may include walking, swimming, or biking.  Work with your health care provider or diet and nutrition specialist (dietitian) to adjust your eating plan to your individual calorie needs. Reading food labels  Check food labels for the amount of sodium per serving. Choose foods with less than 5 percent of the Daily Value of sodium. Generally, foods with less than 300 mg of sodium per serving fit into this eating plan.  To find whole grains, look for the word "whole" as the first word in the ingredient list. Shopping  Buy products labeled as "low-sodium" or "no salt added."  Buy fresh foods. Avoid canned foods and premade or frozen meals. Cooking  Avoid adding salt when cooking. Use salt-free seasonings or herbs instead of table salt or sea salt. Check with your health care provider or pharmacist before using salt substitutes.  Do not fry foods. Cook foods using healthy methods such as baking, boiling, grilling, and broiling instead.  Cook with  heart-healthy oils, such as olive, canola, soybean, or sunflower oil. Meal planning   Eat a balanced diet that includes: ? 5 or more servings of fruits and vegetables each day. At each meal, try to fill half of your plate with fruits and vegetables. ? Up to 6-8 servings of whole grains each day. ? Less than 6 oz of lean meat, poultry, or fish each day. A 3-oz serving of meat is about the same size as a deck of cards. One egg equals 1 oz. ? 2 servings of low-fat dairy each day. ? A serving of nuts, seeds, or beans 5 times each week. ? Heart-healthy fats. Healthy fats called Omega-3 fatty acids are found in foods such as flaxseeds and coldwater fish, like sardines, salmon, and mackerel.  Limit how much you eat of the following: ? Canned or prepackaged foods. ? Food that is high in trans fat, such as fried foods. ? Food that is high in saturated fat, such as fatty meat. ? Sweets, desserts, sugary drinks, and other foods with added sugar. ? Full-fat dairy products.  Do not salt foods before eating.  Try to eat at least 2 vegetarian meals each week.  Eat more home-cooked food and less restaurant, buffet, and fast food.  When eating at a restaurant, ask that your food be prepared with less salt or no salt, if possible. What foods are recommended? The items listed may not be a complete list. Talk with your dietitian about what   dietary choices are best for you. Grains Whole-grain or whole-wheat bread. Whole-grain or whole-wheat pasta. Brown rice. Oatmeal. Quinoa. Bulgur. Whole-grain and low-sodium cereals. Pita bread. Low-fat, low-sodium crackers. Whole-wheat flour tortillas. Vegetables Fresh or frozen vegetables (raw, steamed, roasted, or grilled). Low-sodium or reduced-sodium tomato and vegetable juice. Low-sodium or reduced-sodium tomato sauce and tomato paste. Low-sodium or reduced-sodium canned vegetables. Fruits All fresh, dried, or frozen fruit. Canned fruit in natural juice (without  added sugar). Meat and other protein foods Skinless chicken or turkey. Ground chicken or turkey. Pork with fat trimmed off. Fish and seafood. Egg whites. Dried beans, peas, or lentils. Unsalted nuts, nut butters, and seeds. Unsalted canned beans. Lean cuts of beef with fat trimmed off. Low-sodium, lean deli meat. Dairy Low-fat (1%) or fat-free (skim) milk. Fat-free, low-fat, or reduced-fat cheeses. Nonfat, low-sodium ricotta or cottage cheese. Low-fat or nonfat yogurt. Low-fat, low-sodium cheese. Fats and oils Soft margarine without trans fats. Vegetable oil. Low-fat, reduced-fat, or light mayonnaise and salad dressings (reduced-sodium). Canola, safflower, olive, soybean, and sunflower oils. Avocado. Seasoning and other foods Herbs. Spices. Seasoning mixes without salt. Unsalted popcorn and pretzels. Fat-free sweets. What foods are not recommended? The items listed may not be a complete list. Talk with your dietitian about what dietary choices are best for you. Grains Baked goods made with fat, such as croissants, muffins, or some breads. Dry pasta or rice meal packs. Vegetables Creamed or fried vegetables. Vegetables in a cheese sauce. Regular canned vegetables (not low-sodium or reduced-sodium). Regular canned tomato sauce and paste (not low-sodium or reduced-sodium). Regular tomato and vegetable juice (not low-sodium or reduced-sodium). Pickles. Olives. Fruits Canned fruit in a light or heavy syrup. Fried fruit. Fruit in cream or butter sauce. Meat and other protein foods Fatty cuts of meat. Ribs. Fried meat. Bacon. Sausage. Bologna and other processed lunch meats. Salami. Fatback. Hotdogs. Bratwurst. Salted nuts and seeds. Canned beans with added salt. Canned or smoked fish. Whole eggs or egg yolks. Chicken or turkey with skin. Dairy Whole or 2% milk, cream, and half-and-half. Whole or full-fat cream cheese. Whole-fat or sweetened yogurt. Full-fat cheese. Nondairy creamers. Whipped toppings.  Processed cheese and cheese spreads. Fats and oils Butter. Stick margarine. Lard. Shortening. Ghee. Bacon fat. Tropical oils, such as coconut, palm kernel, or palm oil. Seasoning and other foods Salted popcorn and pretzels. Onion salt, garlic salt, seasoned salt, table salt, and sea salt. Worcestershire sauce. Tartar sauce. Barbecue sauce. Teriyaki sauce. Soy sauce, including reduced-sodium. Steak sauce. Canned and packaged gravies. Fish sauce. Oyster sauce. Cocktail sauce. Horseradish that you find on the shelf. Ketchup. Mustard. Meat flavorings and tenderizers. Bouillon cubes. Hot sauce and Tabasco sauce. Premade or packaged marinades. Premade or packaged taco seasonings. Relishes. Regular salad dressings. Where to find more information:  National Heart, Lung, and Blood Institute: www.nhlbi.nih.gov  American Heart Association: www.heart.org Summary  The DASH eating plan is a healthy eating plan that has been shown to reduce high blood pressure (hypertension). It may also reduce your risk for type 2 diabetes, heart disease, and stroke.  With the DASH eating plan, you should limit salt (sodium) intake to 2,300 mg a day. If you have hypertension, you may need to reduce your sodium intake to 1,500 mg a day.  When on the DASH eating plan, aim to eat more fresh fruits and vegetables, whole grains, lean proteins, low-fat dairy, and heart-healthy fats.  Work with your health care provider or diet and nutrition specialist (dietitian) to adjust your eating plan to your individual   calorie needs. This information is not intended to replace advice given to you by your health care provider. Make sure you discuss any questions you have with your health care provider. Document Released: 07/11/2011 Document Revised: 07/15/2016 Document Reviewed: 07/15/2016 Elsevier Interactive Patient Education  2017 Elsevier Inc.  

## 2016-10-29 NOTE — Assessment & Plan Note (Signed)
Will recheck Doesn't want statin

## 2016-10-30 ENCOUNTER — Encounter: Payer: Self-pay | Admitting: *Deleted

## 2016-11-20 IMAGING — CT CT ABD-PELV W/ CM
2 of 5 series · 16 of 46 positions shown, 18 images · IV contrast (APPLIED)
Comparison: None.

CLINICAL DATA: Right pelvic pain, nausea/vomiting, history of
kidney stones. Prior cholecystectomy and hysterectomy.

EXAM:
CT ABDOMEN AND PELVIS WITH CONTRAST
TECHNIQUE: Multidetector CT imaging of the abdomen and pelvis was performed
using the standard protocol following bolus administration of
intravenous contrast.
CONTRAST:  100mL OMNIPAQUE IOHEXOL 300 MG/ML  SOLN

[Series 2: abd/ pelvis 5.0 i30f 1 · axial · 0.70mm/px · z∈[-414,-69]mm · 13 of 79 slices shown, 15 images]
[im 5/79  soft-tissue]
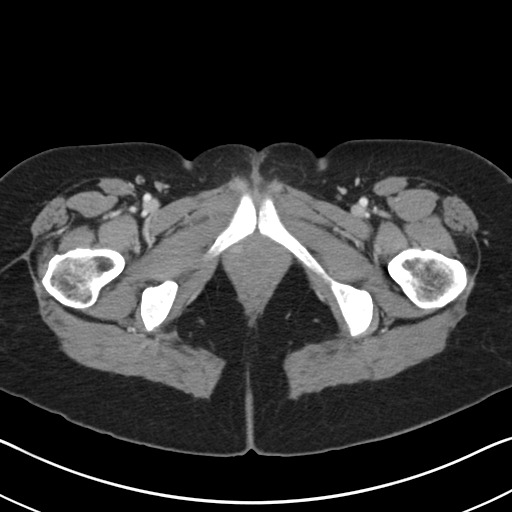
[im 5/79  bone]
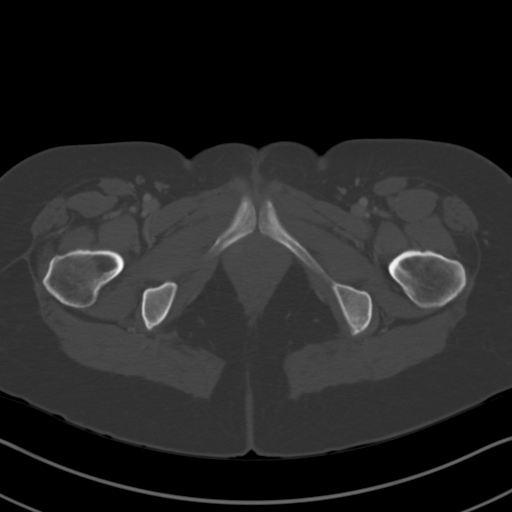
[im 13/79  soft-tissue]
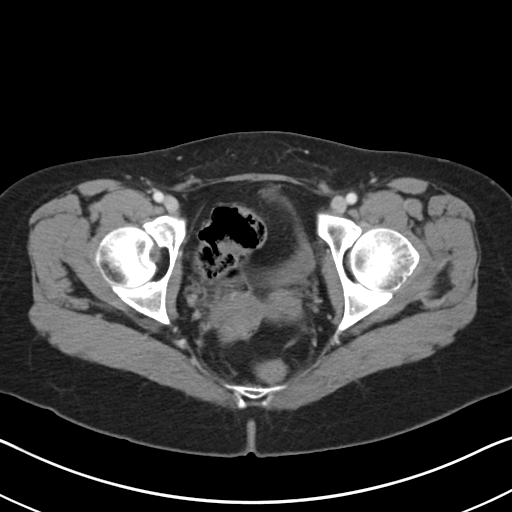
[im 17/79  soft-tissue]
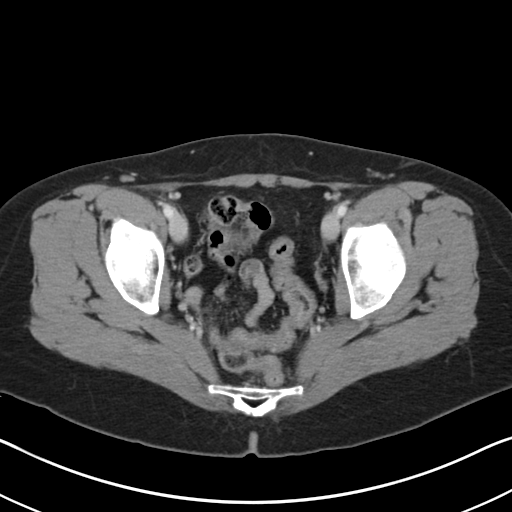
[im 21/79  soft-tissue]
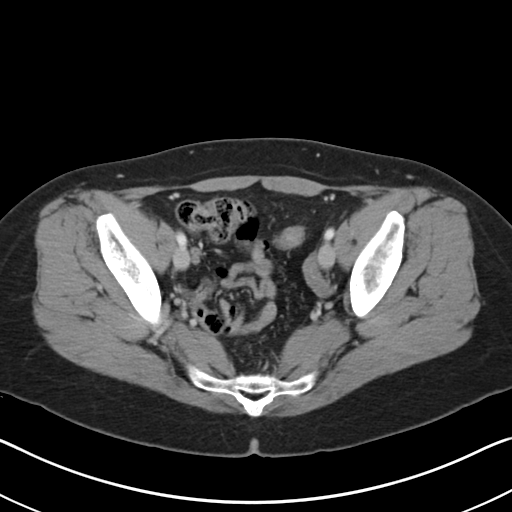
[im 29/79  soft-tissue]
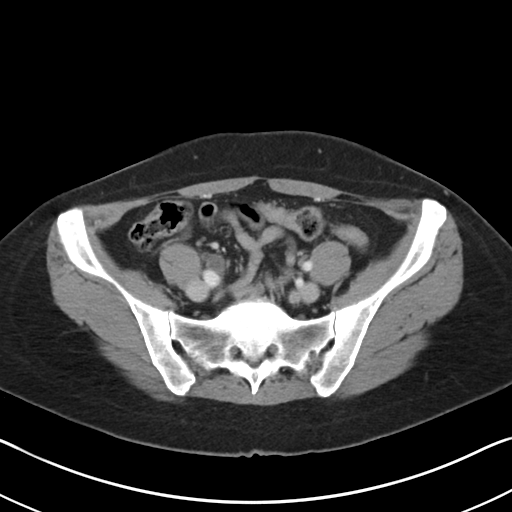
[im 33/79  soft-tissue]
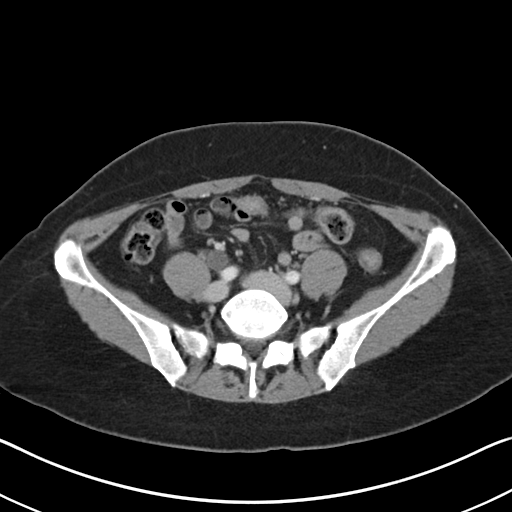
[im 42/79  soft-tissue]
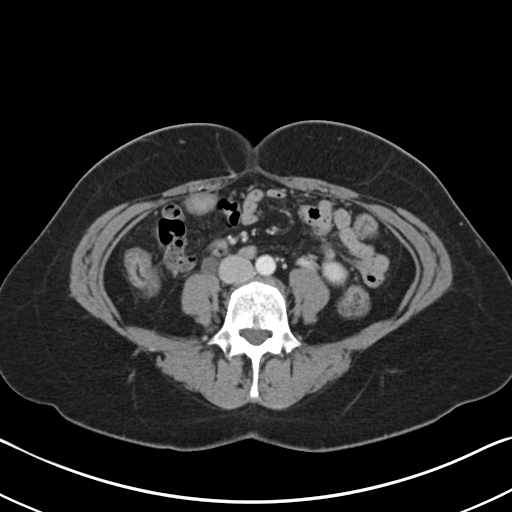
[im 46/79  soft-tissue]
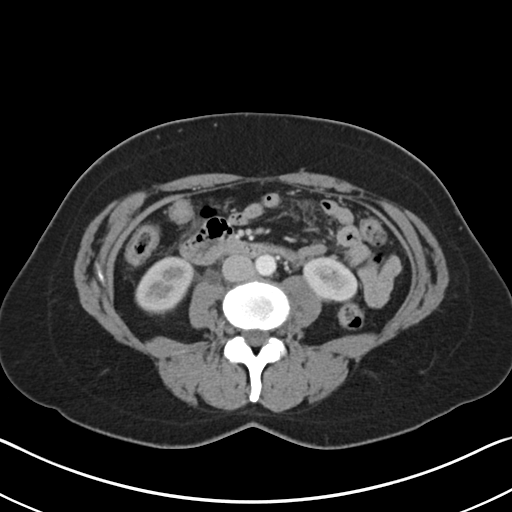
[im 50/79  soft-tissue]
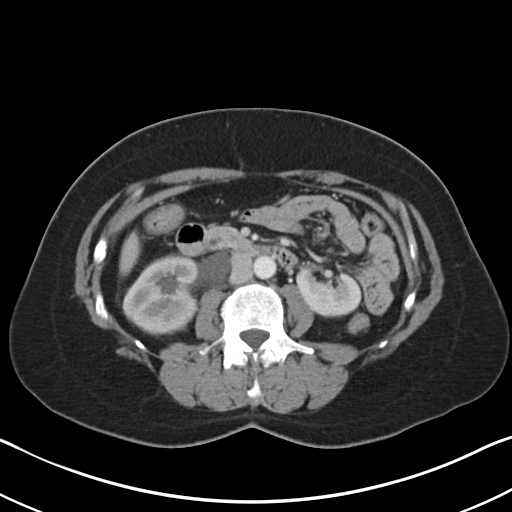
[im 50/79  bone]
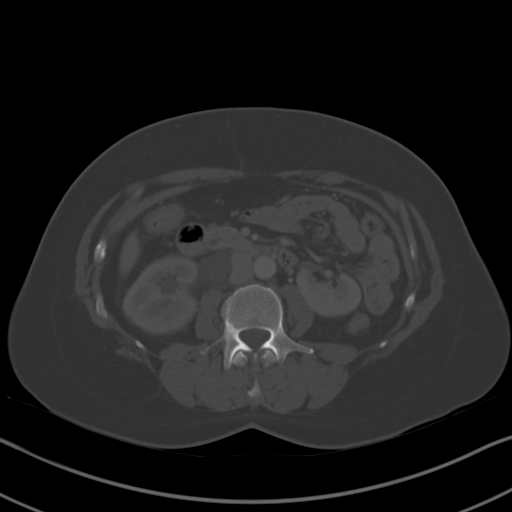
[im 58/79  soft-tissue]
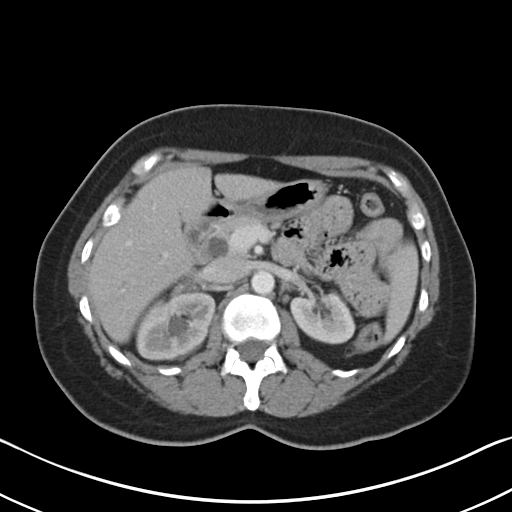
[im 62/79  soft-tissue]
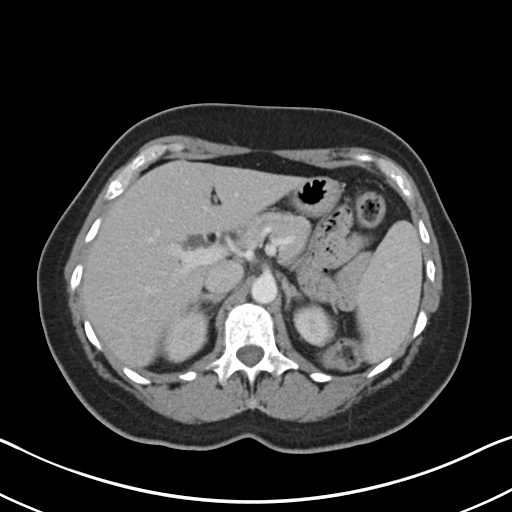
[im 66/79  soft-tissue]
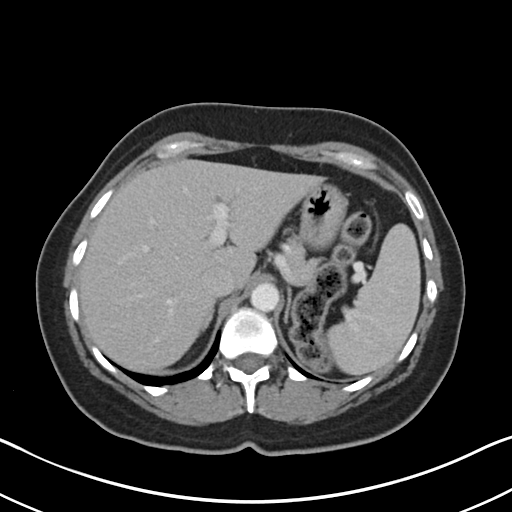
[im 74/79  soft-tissue]
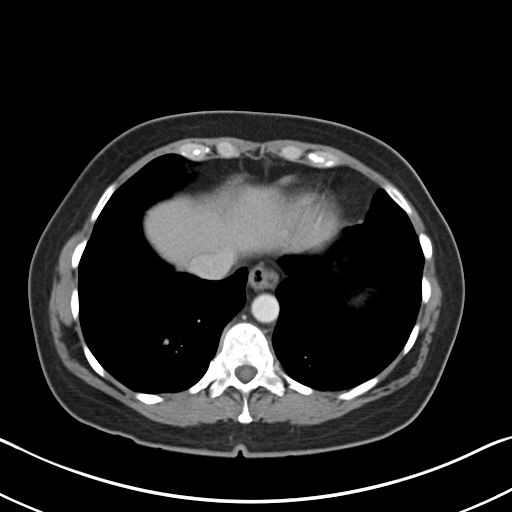

[Series 5: coronal soft tissue · coronal · 0.73mm/px · 3 of 78 slices shown]
[im 26/78  soft-tissue]
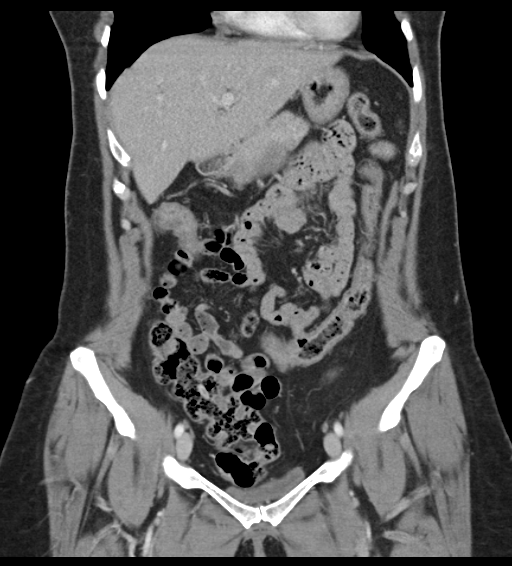
[im 35/78  soft-tissue]
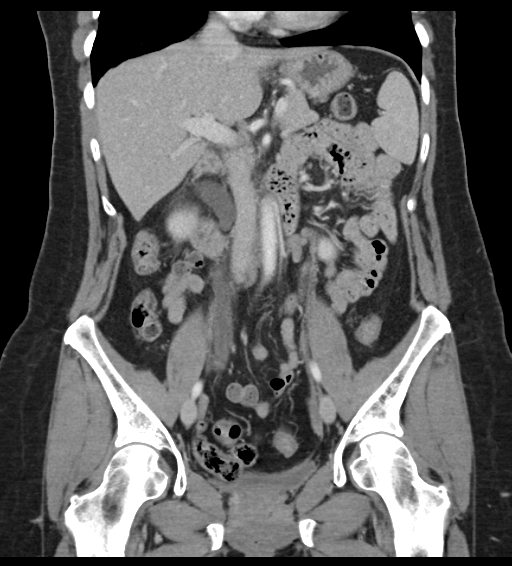
[im 43/78  soft-tissue]
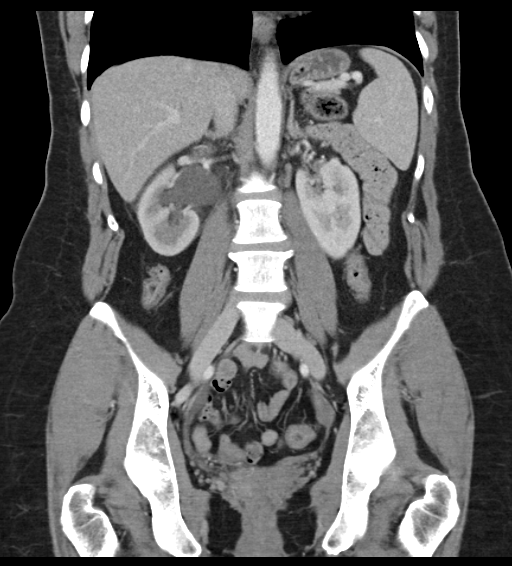

[16 of 46 positions shown; findings below may reference images not displayed]

FINDINGS: Lower chest:  Lung bases are clear.

Hepatobiliary: Liver is notable for tiny cysts measuring up to 5 mm
(series 2/ image 15).

Status post cholecystectomy. No intrahepatic ductal dilatation.
Dilated common duct, measuring 11 mm (series 5/ image 33), but
smoothly tapering at the ampulla, likely postsurgical.

Pancreas: Within normal limits.

Spleen: Within normal limits.

Adrenals/Urinary Tract: Adrenal glands are within normal limits.

Left kidney is within normal limits.

3 mm nonobstructing right upper pole renal calculus (coronal image
54). Mild perirenal edema with mildly diminished enhancement.

Moderate right hydroureteronephrosis. Associated 4 mm distal right
ureteral calculus at the UVJ (series 2/image 68).

Bladder is unremarkable.

Stomach/Bowel: Stomach is notable for a tiny hiatal hernia.

No evidence of bowel obstruction.

Normal appendix (series 2/image 59).

Left colon is decompressed.

Vascular/Lymphatic: No evidence of abdominal aortic aneurysm.

No suspicious abdominopelvic lymphadenopathy.

Reproductive: Status post hysterectomy.

Left ovary is within normal limits.

No right adnexal mass.

Other: No abdominopelvic ascites.

Musculoskeletal: Visualized osseous structures are within normal
limits.
IMPRESSION: 4 mm distal right ureteral calculus at the UVJ. Moderate right
hydroureteronephrosis.

Additional 3 mm nonobstructing right upper pole renal calculus.

## 2016-12-03 ENCOUNTER — Other Ambulatory Visit: Payer: Self-pay | Admitting: Internal Medicine

## 2016-12-03 ENCOUNTER — Other Ambulatory Visit: Payer: Self-pay | Admitting: Family Medicine

## 2016-12-03 NOTE — Telephone Encounter (Signed)
Last office visit 10/29/2016.  Last refilled 10/20/2015 for #30 with no refills.  Ok to refill?

## 2016-12-03 NOTE — Telephone Encounter (Signed)
Approved: 30 x 0 

## 2016-12-04 NOTE — Telephone Encounter (Signed)
Approved: #60 x 0 for tramadol Septra #20 x 1

## 2016-12-04 NOTE — Telephone Encounter (Signed)
[facility] [address]

## 2016-12-04 NOTE — Telephone Encounter (Signed)
Tramadol last filled 10-07-16 #60 Septra is for as needed after intercourse #20  Last OV CPE 10-29-16 No Future OV

## 2016-12-04 NOTE — Telephone Encounter (Signed)
Tramadol refill on voice mail at pharmacy

## 2017-02-03 ENCOUNTER — Other Ambulatory Visit: Payer: Self-pay | Admitting: Internal Medicine

## 2017-02-04 NOTE — Telephone Encounter (Signed)
Left refill on voice mail at pharmacy  

## 2017-02-04 NOTE — Telephone Encounter (Signed)
Approved: #60 x 0 

## 2017-02-04 NOTE — Telephone Encounter (Signed)
Last filled 12-04-16 #60 Last OV/CPE 10-29-16 No Future OV

## 2017-02-21 ENCOUNTER — Encounter: Payer: Self-pay | Admitting: Internal Medicine

## 2017-02-21 ENCOUNTER — Ambulatory Visit (INDEPENDENT_AMBULATORY_CARE_PROVIDER_SITE_OTHER): Payer: 59 | Admitting: Internal Medicine

## 2017-02-21 ENCOUNTER — Telehealth: Payer: Self-pay | Admitting: Internal Medicine

## 2017-02-21 VITALS — BP 110/78 | HR 78 | Temp 98.0°F | Wt 185.0 lb

## 2017-02-21 DIAGNOSIS — M79661 Pain in right lower leg: Secondary | ICD-10-CM | POA: Diagnosis not present

## 2017-02-21 DIAGNOSIS — M7989 Other specified soft tissue disorders: Secondary | ICD-10-CM | POA: Diagnosis not present

## 2017-02-21 DIAGNOSIS — W010XXA Fall on same level from slipping, tripping and stumbling without subsequent striking against object, initial encounter: Secondary | ICD-10-CM | POA: Diagnosis not present

## 2017-02-21 DIAGNOSIS — S80811A Abrasion, right lower leg, initial encounter: Secondary | ICD-10-CM

## 2017-02-21 NOTE — Patient Instructions (Signed)
Abrasion An abrasion is a cut or scrape on the surface of your skin. An abrasion does not go through all of the layers of your skin. It is important to take good care of your abrasion to prevent infection. Follow these instructions at home: Medicines  Take or apply medicines only as told by your doctor.  If you were prescribed an antibiotic ointment, finish all of it even if you start to feel better. Wound care  Clean the wound with mild soap and water 2-3 times per day or as told by your doctor. Pat your wound dry with a clean towel. Do not rub it.  There are many ways to close and cover a wound. Follow instructions from your doctor about: ? How to take care of your wound. ? When and how you should change your bandage (dressing). ? When and how you should take off your dressing.  Check your wound every day for signs of infection. Watch for: ? Redness, swelling, or pain. ? Fluid, blood, or pus. General instructions  Keep the dressing dry as told by your doctor. Do not take baths, swim, use a hot tub, or do anything that would put your wound underwater until your doctor says it is okay.  If there is swelling, raise (elevate) the injured area above the level of your heart while you are sitting or lying down.  Keep all follow-up visits as told by your doctor. This is important. Contact a doctor if:  You were given a tetanus shot and you have any of these where the needle went in: ? Swelling. ? Very bad pain. ? Redness. ? Bleeding.  Medicine does not help your pain.  You have any of these at the site of the wound: ? More redness. ? More swelling. ? More pain. Get help right away if:  You have a red streak going away from your wound.  You have a fever.  You have fluid, blood, or pus coming from your wound.  There is a bad smell coming from your wound. This information is not intended to replace advice given to you by your health care provider. Make sure you discuss any  questions you have with your health care provider. Document Released: 01/08/2008 Document Revised: 12/28/2015 Document Reviewed: 07/20/2014 Elsevier Interactive Patient Education  2018 Elsevier Inc.  

## 2017-02-21 NOTE — Telephone Encounter (Signed)
Patient Name: Sherri Rice  DOB: 07/23/1966    Initial Comment Caller states she fell Tues. Since then her leg has swollen twice the size and gets even larger and her toes turn purple when she stand/walks for longer than an hour.    Nurse Assessment      Guidelines    Guideline Title Affirmed Question Affirmed Notes       Final Disposition User   FINAL ATTEMPT MADE - no message left Bess HarvestSeaton, RN, Marchelle FolksAmanda    Comments  Missed call from RN.  Final attempt: line busy

## 2017-02-21 NOTE — Telephone Encounter (Signed)
I spoke with pt and she has a large abrasion on leg; red background to abrasion. Pt will elevate leg until can  Be seen. Pt works at medical office and concerned about cellulitis. No fever today; had low grade fever on 02/20/17. Dr Alphonsus SiasLetvak advised OK to wait and see Pamala Hurry Baity NP today at 4:15.Swelling goes down after in bed all night. Pt does not want to go to UC or ED. Pt will keep appt with Pamala Hurry Baity NP. If pt condition worsens or has CP or SOB prior to appt pt will go to ED. FYI to Dr Alphonsus SiasLetvak and Pamala Hurry Baity NP.

## 2017-02-21 NOTE — Progress Notes (Signed)
Subjective:    Patient ID: Sherri Rice, female    DOB: 06/16/1966, 51 y.o.   MRN: [id number]  HPI  Pt presents to the clinic today with c/o leg pain and swelling after a fall that occurred on Tuesday. She reports she fell while she was walking down the sidewalk. She has an abrasion on her right lower leg. She has noticed swelling and numbness around the abrasion. She reports the abrasion has drained, clear/bloody fluid. She has noticed that her toes are also turning blue on that right side. She denies redness, warmth, fever or chills. She has tried elevation with some relief, but has not tried anything OTC for this.  Review of Systems      Past Medical History:  Diagnosis Date  . Degeneration of cervical intervertebral disc   . Frequent PVCs   . Hyperlipidemia   . Mitral valve disorders(424.0)   . Personal history of urinary calculi     Current Outpatient Prescriptions  Medication Sig Dispense Refill  . ALPRAZolam (XANAX) 0.25 MG tablet TAKE 1 TABLET BY MOUTH TWICE A DAY AS NEEDED 30 tablet 0  . estradiol (ESTRACE) 0.5 MG tablet TAKE 1 TABLET BY MOUTH ONCE DAILY. PLEASE MAKE APPT WITH MD 90 tablet 0  . fluticasone (FLONASE) 50 MCG/ACT nasal spray Place 2 sprays into both nostrils daily. In each nostril 16 g 12  . sulfamethoxazole-trimethoprim (BACTRIM DS,SEPTRA DS) 800-160 MG tablet TAKE 1 TABLET AFTER INTERCOURSE AS DIRECTED 20 tablet 1  . traMADol (ULTRAM) 50 MG tablet TAKE 1 TABLET BY MOUTH TWICE A DAY AS NEEDED 60 tablet 0  . triamcinolone cream (KENALOG) 0.1 % Apply 1 application topically 2 (two) times daily as needed. 30 g 2   No current facility-administered medications for this visit.     Allergies  Allergen Reactions  . Codeine     REACTION: itching    Family History  Problem Relation Age of Onset  . Breast cancer Mother   . Bipolar disorder Father   . Coronary artery disease Paternal Aunt   . Diabetes Neg Hx   . Cancer Neg Hx     Social History   Social  History  . Marital status: Married    Spouse name: N/A  . Number of children: 2  . Years of education: N/A   Occupational History  . 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 Smoking status: Former Smoker    Types: Cigarettes    Quit date: 08/05/1978  . Smokeless tobacco: Never Used  . Alcohol use Yes  . Drug use: No  . Sexual activity: Not on file   Other Topics Concern  . Not on file   Social History Narrative  . No narrative on file     Constitutional: Denies fever, malaise, fatigue, headache or abrupt weight changes.  Musculoskeletal: Denies decrease in range of motion, difficulty with gait, muscle pain or joint pain and swelling.  Skin: Pt reports abrasion to right lower leg. Denies  ulcercations.  Neurological: Pt reports numbness of right lower leg. Denies dizziness, difficulty with memory, difficulty with speech or problems with balance and coordination.    No other specific complaints in a complete review of systems (except as listed in HPI above).  Objective:   Physical Exam   BP 110/78   Pulse 78   Temp 98 F (36.7 C) (Oral)   Wt 185 lb (83.9 kg)   SpO2 98%   BMI  31.26 kg/m  Wt Readings from Last 3 Encounters:  02/21/17 185 lb (83.9 kg)  10/29/16 180 lb (81.6 kg)  08/28/15 170 lb (77.1 kg)    General: Appears her stated age, obese in NAD. Skin: Abrasion noted to RLE, routine healing noted. No concern for cellulitis. Musculoskeletal: Normal flexion and extension of the right knee. Pain with palpation of the medial bursa. Swelling noted of the RLE. No difficulty with gait.  Neurological: Alert and oriented. Sensation intact to RLE.  BMET    Component Value Date/Time   NA 139 10/29/2016 0923   K 4.5 10/29/2016 0923   CL 103 10/29/2016 0923   CO2 29 10/29/2016 0923   GLUCOSE 94 10/29/2016 0923   BUN 12 10/29/2016 0923   CREATININE 1.12 10/29/2016 0923   CALCIUM 9.7 10/29/2016 0923   GFRNONAA 55 (L) 06/03/2015 1315    GFRAA >60 06/03/2015 1315    Lipid Panel     Component Value Date/Time   CHOL 241 (H) 10/29/2016 0923   TRIG 78.0 10/29/2016 0923   HDL 69.10 10/29/2016 0923   CHOLHDL 3 10/29/2016 0923   VLDL 15.6 10/29/2016 0923   LDLCALC 156 (H) 10/29/2016 0923    CBC    Component Value Date/Time   WBC 5.5 10/29/2016 0923   RBC 4.58 10/29/2016 0923   HGB 14.1 10/29/2016 0923   HCT 41.8 10/29/2016 0923   PLT 287.0 10/29/2016 0923   MCV 91.3 10/29/2016 0923   MCH 30.5 06/03/2015 1315   MCHC 33.7 10/29/2016 0923   RDW 13.5 10/29/2016 0923   LYMPHSABS 1.9 10/29/2016 0923   MONOABS 0.4 10/29/2016 0923   EOSABS 0.1 10/29/2016 0923   BASOSABS 0.1 10/29/2016 0923    Hgb A1C No results found for: HGBA1C           Assessment & Plan:   Abrasion, Swelling RLE secondary to Fall:  Encouraged elevation Use Neosporin OTC BID Can take Ibuprofen and Ice as needed ABX not indicated, no cellulitis  Return precautions discussed Nicki Reaper, NP

## 2017-02-21 NOTE — Telephone Encounter (Signed)
That sounds appropriate.

## 2017-04-10 ENCOUNTER — Other Ambulatory Visit: Payer: Self-pay | Admitting: Internal Medicine

## 2017-04-10 NOTE — Telephone Encounter (Signed)
Last filled 02-04-17 #60 Last CPE 10-29-16 No Future OV

## 2017-04-10 NOTE — Telephone Encounter (Signed)
Approved: #60 x 0 

## 2017-04-10 NOTE — Telephone Encounter (Signed)
Left refill on voice mail at pharmacy  

## 2017-06-12 ENCOUNTER — Other Ambulatory Visit: Payer: Self-pay | Admitting: Internal Medicine

## 2017-06-12 NOTE — Telephone Encounter (Signed)
Last filled 04-10-17 #60 Last OV 10-29-16 No Future OV

## 2017-06-13 NOTE — Telephone Encounter (Signed)
Left refill on voice mail at pharmacy  

## 2017-06-13 NOTE — Telephone Encounter (Signed)
Approved: #60 x 0 

## 2017-06-25 ENCOUNTER — Other Ambulatory Visit: Payer: Self-pay | Admitting: Internal Medicine

## 2017-06-25 NOTE — Telephone Encounter (Signed)
Last Rx 12/2016 #20 1R. Last OV 02/2017-acute

## 2017-08-08 ENCOUNTER — Other Ambulatory Visit: Payer: Self-pay | Admitting: Internal Medicine

## 2017-08-08 NOTE — Telephone Encounter (Signed)
Electronic refill Last refill 52/18 #30 Last office visit 02/21/17/acute

## 2017-08-08 NOTE — Telephone Encounter (Signed)
No history of anxiety on problem list, no mention of this in her recent physical. Refill will need to wait until PCP returns.

## 2017-08-08 NOTE — Telephone Encounter (Signed)
Approved: 30 x 0 

## 2017-08-11 NOTE — Telephone Encounter (Signed)
Left refill on voice mail at pharmacy  

## 2017-08-14 ENCOUNTER — Other Ambulatory Visit: Payer: Self-pay | Admitting: Family Medicine

## 2017-08-14 ENCOUNTER — Other Ambulatory Visit: Payer: Self-pay | Admitting: Internal Medicine

## 2017-08-14 NOTE — Telephone Encounter (Signed)
Left refill on voice mail at pharmacy  

## 2017-08-14 NOTE — Telephone Encounter (Signed)
Last office visit 02/21/2017.  Last refilled 06/25/2017 for #20 with 1 refill.  Ok to refill?

## 2017-08-14 NOTE — Telephone Encounter (Signed)
Approved: #60 x 0 

## 2017-08-14 NOTE — Telephone Encounter (Signed)
Approved: #20 x 1

## 2017-08-14 NOTE — Telephone Encounter (Signed)
Last filled 06-13-17 #60 Last OV 02-21-17 No Future OV

## 2017-10-23 ENCOUNTER — Telehealth: Payer: Self-pay | Admitting: Internal Medicine

## 2017-10-23 NOTE — Telephone Encounter (Signed)
Carrie see prev message, Dr. Alphonsus SiasLetvak has filled med and pt just needs CPE scheduled

## 2017-10-23 NOTE — Telephone Encounter (Signed)
I spoke to patient and let her know her medication was refilled.  Patient said her mother is having surgery next week and she'll call back after that to schedule CPX.

## 2017-10-23 NOTE — Telephone Encounter (Signed)
Have her set up a physical within the next few months

## 2017-10-23 NOTE — Telephone Encounter (Signed)
Last CPE was on 10/29/16, last filled on 08/14/17 #60 tabs with 0 refills

## 2017-12-06 DIAGNOSIS — R300 Dysuria: Secondary | ICD-10-CM | POA: Diagnosis not present

## 2018-01-14 ENCOUNTER — Other Ambulatory Visit: Payer: Self-pay | Admitting: Internal Medicine

## 2018-01-14 NOTE — Telephone Encounter (Signed)
Pt last saw Dr Alphonsus SiasLetvak for annual exam on 10/29/16 Pt is scheduled for annual exam on 05/20/18 Do not see UDS under lab tab.

## 2018-01-14 NOTE — Telephone Encounter (Signed)
Refill request Ultram 50 mg  Refill request Estrace 0.5 mg  [facility]   Last filled  Ultram 50 mg bid prn qty 60  10/23/2017 Dr Alphonsus SiasLetvak Last filled Estrace 0.5 mg qty  90 tabs  10/02/2015  Dr Alphonsus SiasLetvak   Patient has made an appointment for 05/20/2018 with Dr Alphonsus SiasLetvak  [facility] [address] [place]

## 2018-01-14 NOTE — Telephone Encounter (Signed)
Copied from CRM [phone]. Topic: Quick Communication - Rx Refill/Question >> Jan 14, 2018 10:30 AM Oneal GroutSebastian, Jennifer S wrote: Medication: traMADol (ULTRAM) 50 MG tablet and estradiol (ESTRACE) 0.5 MG tablet Has CPE scheduled for 05/20/18  Has the patient contacted their pharmacy? Yes.   (Agent: If no, request that the patient contact the pharmacy for the refill.) (Agent: If yes, when and what did the pharmacy advise?)  Preferred Pharmacy (with phone number or street name): [facility] [address]  Jeannine BogaAgent: Please be advised that RX refills may take up to 3 business days. We ask that you follow-up with your pharmacy.

## 2018-01-15 ENCOUNTER — Other Ambulatory Visit: Payer: Self-pay | Admitting: Internal Medicine

## 2018-01-15 MED ORDER — ESTRADIOL 0.5 MG PO TABS: ORAL_TABLET | ORAL | 1 refills | Status: DC

## 2018-01-15 MED ORDER — TRAMADOL HCL 50 MG PO TABS: 50.0000 mg | ORAL_TABLET | Freq: Two times a day (BID) | ORAL | 0 refills | Status: DC | PRN

## 2018-01-15 NOTE — Telephone Encounter (Signed)
Last filled 10-23-17 #60  Last OV 02-21-17 Next OV 05-20-18

## 2018-02-17 ENCOUNTER — Telehealth: Payer: Self-pay | Admitting: Internal Medicine

## 2018-02-17 NOTE — Telephone Encounter (Signed)
Spoke to pharmacist, Marchelle FolksAmanda, who confirmed Rx not received. Verbal order given

## 2018-02-17 NOTE — Telephone Encounter (Signed)
Copied from CRM [phone]. Topic: General - Other >> Feb 17, 2018  9:52 AM Leafy Roobinson, Norma J wrote: Reason for CRM [facility] [address] [facility] [place] said they never received refill request for estrace. Please resend

## 2018-04-15 ENCOUNTER — Other Ambulatory Visit: Payer: Self-pay | Admitting: Internal Medicine

## 2018-04-16 NOTE — Telephone Encounter (Signed)
Last filled 08-11-17 #30 Last OV 02-21-17  Next OV 05-20-18

## 2018-04-28 ENCOUNTER — Other Ambulatory Visit: Payer: Self-pay | Admitting: Internal Medicine

## 2018-04-28 NOTE — Telephone Encounter (Signed)
Last filled 01-15-18 #60 Last OV 02-21-17 Next OV 05-20-18

## 2018-05-20 ENCOUNTER — Ambulatory Visit (INDEPENDENT_AMBULATORY_CARE_PROVIDER_SITE_OTHER): Payer: 59 | Admitting: Internal Medicine

## 2018-05-20 ENCOUNTER — Encounter: Payer: Self-pay | Admitting: Internal Medicine

## 2018-05-20 VITALS — BP 118/80 | HR 74 | Temp 98.1°F | Ht 64.25 in | Wt 189.0 lb

## 2018-05-20 DIAGNOSIS — Z0000 Encounter for general adult medical examination without abnormal findings: Secondary | ICD-10-CM | POA: Diagnosis not present

## 2018-05-20 DIAGNOSIS — E785 Hyperlipidemia, unspecified: Secondary | ICD-10-CM

## 2018-05-20 DIAGNOSIS — K2100 Gastro-esophageal reflux disease with esophagitis, without bleeding: Secondary | ICD-10-CM | POA: Insufficient documentation

## 2018-05-20 DIAGNOSIS — Z1211 Encounter for screening for malignant neoplasm of colon: Secondary | ICD-10-CM

## 2018-05-20 DIAGNOSIS — N951 Menopausal and female climacteric states: Secondary | ICD-10-CM | POA: Insufficient documentation

## 2018-05-20 DIAGNOSIS — R0789 Other chest pain: Secondary | ICD-10-CM

## 2018-05-20 LAB — COMPREHENSIVE METABOLIC PANEL
ALBUMIN: 4.3 g/dL (ref 3.5–5.2)
ALT: 50 U/L — ABNORMAL HIGH (ref 0–35)
AST: 42 U/L — ABNORMAL HIGH (ref 0–37)
Alkaline Phosphatase: 137 U/L — ABNORMAL HIGH (ref 39–117)
BUN: 15 mg/dL (ref 6–23)
CALCIUM: 9.4 mg/dL (ref 8.4–10.5)
CHLORIDE: 103 meq/L (ref 96–112)
CO2: 26 meq/L (ref 19–32)
Creatinine, Ser: 0.97 mg/dL (ref 0.40–1.20)
GFR: 63.52 mL/min (ref >60.00)
Glucose, Bld: 88 mg/dL (ref 70–99)
Potassium: 4.4 mEq/L (ref 3.5–5.1)
Sodium: 137 mEq/L (ref 135–145)
Total Bilirubin: 0.4 mg/dL (ref 0.2–1.2)
Total Protein: 7.5 g/dL (ref 6.0–8.3)

## 2018-05-20 LAB — CBC
HCT: 44 % (ref 36.0–46.0)
HEMOGLOBIN: 14.6 g/dL (ref 12.0–15.0)
MCHC: 33.1 g/dL (ref 30.0–36.0)
MCV: 89.8 fl (ref 78.0–100.0)
PLATELETS: 309 10*3/uL (ref 150.0–400.0)
RBC: 4.91 Mil/uL (ref 3.87–5.11)
RDW: 14.3 % (ref 11.5–15.5)
WBC: 7.6 10*3/uL (ref 4.0–10.5)

## 2018-05-20 LAB — LIPID PANEL
CHOLESTEROL: 228 mg/dL — AB (ref 0–200)
HDL: 71.2 mg/dL (ref >39.00)
LDL CALC: 142 mg/dL — AB (ref 0–99)
NonHDL: 156.92
TRIGLYCERIDES: 73 mg/dL (ref 0.0–149.0)
Total CHOL/HDL Ratio: 3
VLDL: 14.6 mg/dL (ref 0.0–40.0)

## 2018-05-20 LAB — T4, FREE: FREE T4: 0.73 ng/dL (ref 0.60–1.60)

## 2018-05-20 MED ORDER — PANTOPRAZOLE SODIUM 40 MG PO TBEC: 40.0000 mg | DELAYED_RELEASE_TABLET | Freq: Every day | ORAL | 3 refills | Status: DC

## 2018-05-20 NOTE — Assessment & Plan Note (Signed)
Still interested in statin

## 2018-05-20 NOTE — Assessment & Plan Note (Addendum)
Likely related to esophagitis but happening on walk Will check EKG Shows sinus at 79. ?incomplete RBBB. Normal axis and intervals. No ischemic changes. No comparison available  Will plan to do stress test if any recurrent CP after treating esophagitis

## 2018-05-20 NOTE — Assessment & Plan Note (Signed)
Still on estrogen--didn't tolerate wean

## 2018-05-20 NOTE — Assessment & Plan Note (Signed)
Discussed exercise Mammogram is scheduled Will set up colon---not able to finish the FIT 2 years in a row Prefers no flu vaccine

## 2018-05-20 NOTE — Patient Instructions (Signed)
Please let me know if you have any ongoing symptoms with exertion.

## 2018-05-20 NOTE — Assessment & Plan Note (Signed)
Will try protonix (see if insurance will cover) May need EGD

## 2018-05-20 NOTE — Progress Notes (Signed)
Subjective:    Patient ID: Sherri Rice, female    DOB: 08-10-1965, 52 y.o.   MRN: [id number]  HPI Here for physical  No major medical issues Vertigo is bad now--relates barometric pressure  Has had a couple of small kidney stones--- didn't save Has increased citrate intake  Having significant reflux symptoms Dysphagia with food---feels something at the base of her food pipe Will get sense of shortness of breath and has to stop walking due to chest squeezing (happens every time she walks the dog) Used OTC zantac--but sporadically Past reflux---didn't do well with omeprazole but did okay with acidphex  Had gained a lot of weight and now lost it---Adkins bars and low carb diet Back to about where she was last year  Still takes the daily tramadol Mostly for her neck  On estrogen daily still Tried to wean about a year ago --- "a disaster"  Current Outpatient Medications on File Prior to Visit  Medication Sig Dispense Refill  . ALPRAZolam (XANAX) 0.25 MG tablet TAKE 1 TABLET BY MOUTH TWICE A DAY AS NEEDED 30 tablet 0  . estradiol (ESTRACE) 0.5 MG tablet TAKE 1 TABLET BY MOUTH ONCE DAILY. 90 tablet 1  . fluticasone (FLONASE) 50 MCG/ACT nasal spray Place 2 sprays into both nostrils daily. In each nostril 16 g 12  . sulfamethoxazole-trimethoprim (BACTRIM DS,SEPTRA DS) 800-160 MG tablet Take 1 tablet by mouth as needed.    . traMADol (ULTRAM) 50 MG tablet TAKE 1 TABLET BY MOUTH TWICE A DAY AS NEEDED 60 tablet 0  . triamcinolone cream (KENALOG) 0.1 % Apply 1 application topically 2 (two) times daily as needed. 30 g 2   No current facility-administered medications on file prior to visit.     Allergies  Allergen Reactions  . Codeine     REACTION: itching    Past Medical History:  Diagnosis Date  . Degeneration of cervical intervertebral disc   . Frequent PVCs   . Hyperlipidemia   . Mitral valve disorders(424.0)   . Personal history of urinary calculi     Past Surgical  History:  Procedure Laterality Date  . ABDOMINAL HYSTERECTOMY    . CARDIOVASCULAR STRESS TEST  6/16   with contrast---was normal  . CHOLECYSTECTOMY    . TONSILLECTOMY    . TONSILLECTOMY      Family History  Problem Relation Age of Onset  . Breast cancer Mother   . Bipolar disorder Father   . Coronary artery disease Paternal Aunt   . Diabetes Neg Hx   . Cancer Neg Hx     Social History   Socioeconomic History  . Marital status: Married    Spouse name: Not on file  . Number of children: 2  . Years of education: Not on file  . Highest education level: Not on file  Occupational History  . Occupation: PA    Employer: [employer]    Comment: rare disease management  Social Needs  . Financial resource strain: Not on file  . Food insecurity:    Worry: Not on file    Inability: Not on file  . Transportation needs:    Medical: Not on file    Non-medical: Not on file  Tobacco Use  . Smoking status: Former Smoker    Types: Cigarettes    Last attempt to quit: 08/05/1978    Years since quitting: 39.8  . Smokeless tobacco: Never Used  Substance and Sexual Activity  . Alcohol use: Yes  . Drug use: No  .  Sexual activity: Not on file  Lifestyle  . Physical activity:    Days per week: Not on file    Minutes per session: Not on file  . Stress: Not on file  Relationships  . Social connections:    Talks on phone: Not on file    Gets together: Not on file    Attends religious service: Not on file    Active member of club or organization: Not on file    Attends meetings of clubs or organizations: Not on file    Relationship status: Not on file  . Intimate partner violence:    Fear of current or ex partner: Not on file    Emotionally abused: Not on file    Physically abused: Not on file    Forced sexual activity: Not on file  Other Topics Concern  . Not on file  Social History Narrative  . Not on file   Review of Systems  Constitutional: Positive for fatigue. Negative for  unexpected weight change.       Wears seat belt  HENT: Positive for hearing loss and trouble swallowing. Negative for dental problem and tinnitus.        Keeps up the dentist  Eyes: Negative for visual disturbance.       No diplopia or unilateral vision loss  Respiratory: Positive for chest tightness. Negative for cough and shortness of breath.   Cardiovascular: Negative for palpitations and leg swelling.  Gastrointestinal:       Has had some dark stools at times---when worst esophageal symptoms  Endocrine: Negative for polydipsia and polyuria.  Genitourinary: Negative for difficulty urinating, dyspareunia, dysuria and hematuria.  Musculoskeletal: Negative for arthralgias, back pain and joint swelling.  Skin: Negative for rash.       No suspicious lesions  Allergic/Immunologic: Negative for environmental allergies and immunocompromised state.  Neurological: Positive for dizziness. Negative for syncope and headaches.  Hematological: Negative for adenopathy. Does not bruise/bleed easily.  Psychiatric/Behavioral: Negative for dysphoric mood and sleep disturbance. The patient is not nervous/anxious.        Objective:   Physical Exam  Constitutional: She is oriented to person, place, and time. She appears well-developed. No distress.  HENT:  Head: Normocephalic and atraumatic.  Right Ear: External ear normal.  Left Ear: External ear normal.  Mouth/Throat: Oropharynx is clear and moist. No oropharyngeal exudate.  Eyes: Pupils are equal, round, and reactive to light. Conjunctivae are normal.  Neck: No thyromegaly present.  Cardiovascular: Normal rate, regular rhythm, normal heart sounds and intact distal pulses. Exam reveals no gallop.  No murmur heard. Respiratory: Effort normal and breath sounds normal. No respiratory distress. She has no wheezes. She has no rales.  GI: Soft. There is no tenderness.  Musculoskeletal: She exhibits no edema or tenderness.  Lymphadenopathy:    She has  no cervical adenopathy.  Neurological: She is alert and oriented to person, place, and time.  Skin: No rash noted. No erythema.  Psychiatric: She has a normal mood and affect. Her behavior is normal.           Assessment & Plan:

## 2018-05-22 ENCOUNTER — Encounter: Payer: Self-pay | Admitting: Gastroenterology

## 2018-06-03 ENCOUNTER — Other Ambulatory Visit: Payer: Self-pay | Admitting: Internal Medicine

## 2018-06-06 DIAGNOSIS — Z1231 Encounter for screening mammogram for malignant neoplasm of breast: Secondary | ICD-10-CM | POA: Diagnosis not present

## 2018-06-11 ENCOUNTER — Telehealth: Payer: Self-pay | Admitting: Internal Medicine

## 2018-06-11 DIAGNOSIS — Z803 Family history of malignant neoplasm of breast: Secondary | ICD-10-CM | POA: Diagnosis not present

## 2018-06-11 DIAGNOSIS — N6001 Solitary cyst of right breast: Secondary | ICD-10-CM | POA: Diagnosis not present

## 2018-06-11 DIAGNOSIS — N6002 Solitary cyst of left breast: Secondary | ICD-10-CM | POA: Diagnosis not present

## 2018-06-11 NOTE — Telephone Encounter (Signed)
Signed order faxed to [phone] as noted on the form we received. Fax confirmation form received

## 2018-06-11 NOTE — Telephone Encounter (Signed)
Order signed.

## 2018-06-11 NOTE — Telephone Encounter (Signed)
Pt called in saying she had a screening mammogram done. She received a cb due to spots being found. [facility] wants her to come in for a Bilat Dx, Bilat ultrasound, and Biopsy if needed. Please place orders. Fax # for [facility] is [phone]

## 2018-06-11 NOTE — Telephone Encounter (Signed)
Orders written and given to Dr Alphonsus Sias to sign.

## 2018-06-11 NOTE — Telephone Encounter (Signed)
Hannah @ [facility] called needing order by 3 if possible.  Pt has appointment tonight @ 8pm   Best number [phone]

## 2018-06-15 NOTE — Telephone Encounter (Signed)
Results received for additional mammogram and ultrasound and everything was normal. Repeat in 1 year.  Left message to call office.  If pt calls back, please advise. Thanks.

## 2018-06-15 NOTE — Telephone Encounter (Signed)
Spoke to pt

## 2018-06-22 NOTE — Progress Notes (Signed)
Referring Provider: Venia Carbon, MD Primary Care Physician:  Venia Carbon, MD   Reason for Consultation: Reflux and dysphasia   IMPRESSION:  Epigastric pain with reflux Dysphagia to solids Chest pain/squeezing  History of PUD diagnosed on endoscopy 15 years ago Abnormal liver enzymes - AST 42, ALT 50, alk phos 137 Need for colon cancer screening, last colonoscopy 15 years ago Cholecystectomy for cholelithiasis 1999 BMI 31  Unclear if there is a unifying diagnosis of abnormal liver enzymes and GI symptoms. Will obtain an abdominal ultrasound to exclude recurrent stones and to evaluate the hepatic parenchyma.  GI symptoms may be related to recurrent PUD, esophagitis, gastritis, or H pylori. I recommend increasing the PPI given her persistent chest discomfort and an EGD for evaluation.  However, cardiac etiologies must be excluded prior to proceeding with endoscopy. I asked her to follow up with Dr. Silvio Pate for cardiac clearance.   Surveillance colonoscopy recommended as her last exam was 15 years ago.   Abnormal liver enzymes may be due to fatty liver in the setting of recent weight gain. Will proceed with serologic evaluation of common causes of abnormal liver enzymes and obtain an ultrasound to evaluate the hepatic parenchyma.     PLAN: Obtain records regarding her colonoscopy from [place] about 15 years ago Pantoprazole 40 mg BID Avoid NSAIDs Labs: hepatic function, fasting insulin and glucose, hepatitis C antibody, Hepatitis B surface antigen, Hepatitis B core antibody, ferritin, IgG, ANA, AMA, IgM Abdominal ultrasound to evaluate abnormal liver enzymes EGD and Colonoscopy if cardiac evaluation is negative (Dr. Silvio Pate suggested a stress test. Will await cardiac clearance to schedule)  I consented the patient at the bedside today discussing the risks, benefits, and alternatives to endoscopic evaluation. In particular, we discussed the risks that include, but are  not limited to, reaction to medication, cardiopulmonary compromise, bleeding requiring blood transfusion, aspiration resulting in pneumonia, perforation requiring surgery, and even death. We reviewed the risk of missed lesion including polyps or even cancer. The patient acknowledges these risks and asks that we proceed.  HPI: Sherri Rice is a 52 y.o. seen in consultation at the request of Dr. Silvio Pate. Former radiographic specialist x 18 years.  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  The history is obtained through the patient and review of her electronic health record.  She has cervical disc disease, frequent PVCs, hyperlipidemia and a history of nephrolithiasis.  Endoscopic diagnosed with peptic ulcer disease 15 years ago. Treated with as needed use of over-the-counter Zantac.  Her symptoms did not respond to omeprazole.  She has recently had escalating reflux symptoms.  There is associated dysphasia to solids. Burning when any food hits the xiphoid.  She feels something sticking "bottom of her food pipe."  She had unintentional weight gain last year but has subsequently lost at all.  "Not feeling well. Nocturnal symptoms. Chronic dysphonia that she attributes due to reflux over the last two years. No nausea or vomiting. No change in bowel habits. No melena, hematochezia, or bright red blood per rectum.  No other associated symptoms. No identified exacerbating or relieving features.   Occasional pale gray stools that occurred over the last 3 years. No association with diet or medications. Unable to identify any triggers.   There is an associated shortness of breath and chest squeezing that resolves when she stops walking (happens every time she walks the dog).  Dr. Silvio Pate was concerned that this could be related to esophagitis.  She had an EKG showing  an incomplete right bundle blanch block and sinus rhythm at 79.  There was no old EKG for comparison.   He started her on Protonix 05/20/2018 and  suggested a stress test for chest pain recurred after treating esophagitis. No improvement despite 3 weeks of Protonix. Continues to have the chest squeezing.   She reports abnormal liver enzymes over the last year.  Tramadol daily for neck pain. No other NSAIDs. Rare alcohol. No associated symptoms. No identified exacerbating or relieving features.  The abnormal liver enzymes have not been further evaluated with labs or imaging.   Colonoscopy 15 years ago in [place].  She was not able to finish the FIT 2 years in a row due to her stool.  Mother with colonic abscesses requiring resections and followed by Dr. Olevia Perches. Maternal grand mother with colon cancer. Father with fatty liver.   Past Medical History:  Diagnosis Date  . Degeneration of cervical intervertebral disc   . Frequent PVCs   . Gallstones   . Hyperlipidemia   . Kidney stones   . Mitral valve disorders(424.0)   . Personal history of urinary calculi   . Pneumonia     Past Surgical History:  Procedure Laterality Date  . ABDOMINAL HYSTERECTOMY    . CARDIOVASCULAR STRESS TEST  6/16   with contrast---was normal  . CHOLECYSTECTOMY    . TONSILLECTOMY    . TONSILLECTOMY      Current Outpatient Medications  Medication Sig Dispense Refill  . ALPRAZolam (XANAX) 0.25 MG tablet TAKE 1 TABLET BY MOUTH TWICE A DAY AS NEEDED 30 tablet 0  . estradiol (ESTRACE) 0.5 MG tablet TAKE 1 TABLET BY MOUTH ONCE DAILY. 90 tablet 1  . pantoprazole (PROTONIX) 40 MG tablet Take 1 tablet (40 mg total) by mouth daily. 90 tablet 3  . sulfamethoxazole-trimethoprim (BACTRIM DS,SEPTRA DS) 800-160 MG tablet TAKE 1 TABLET AFTER INTERCOURSE AS DIRECTED 20 tablet 1  . traMADol (ULTRAM) 50 MG tablet Take 50 mg by mouth every morning.     No current facility-administered medications for this visit.     Allergies as of 06/23/2018 - Review Complete 06/23/2018  Allergen Reaction Noted  . Codeine  05/25/2010    Family History  Problem Relation Age of  Onset  . Breast cancer Mother   . Colon cancer Mother   . Bipolar disorder Father   . Coronary artery disease Paternal Aunt   . Colon cancer Maternal Grandmother   . Diabetes Neg Hx   . Cancer Neg Hx     Social History   Socioeconomic History  . Marital status: Married    Spouse name: Not on file  . Number of children: 2  . Years of education: Not on file  . Highest education level: Not on file  Occupational History  . Occupation: PA    Employer: [employer]    Comment: rare disease management  Social Needs  . Financial resource strain: Not on file  . Food insecurity:    Worry: Not on file    Inability: Not on file  . Transportation needs:    Medical: Not on file    Non-medical: Not on file  Tobacco Use  . Smoking status: Former Smoker    Types: Cigarettes    Last attempt to quit: 08/05/1978    Years since quitting: 39.9  . Smokeless tobacco: Never Used  Substance and Sexual Activity  . Alcohol use: Yes  . Drug use: No  . Sexual activity: Not on file  Lifestyle  .  Physical activity:    Days per week: Not on file    Minutes per session: Not on file  . Stress: Not on file  Relationships  . Social connections:    Talks on phone: Not on file    Gets together: Not on file    Attends religious service: Not on file    Active member of club or organization: Not on file    Attends meetings of clubs or organizations: Not on file    Relationship status: Not on file  . Intimate partner violence:    Fear of current or ex partner: Not on file    Emotionally abused: Not on file    Physically abused: Not on file    Forced sexual activity: Not on file  Other Topics Concern  . Not on file  Social History Narrative  . Not on file    Review of Systems: 12 system ROS is negative except as noted above with the additions of cough, night sweats, sore threat .  Filed Weights   06/23/18 0841  Weight: 191 lb 6 oz (86.8 kg)    Physical Exam: Vital signs were reviewed. General:    Alert, in NAD. No scleral icterus. No bilateral temporal wasting.  Heart:  Regular rate and rhythm; no murmurs Pulm: Clear anteriorly; no wheezing Abdomen:  Soft. Central obesity. Nontender. Nondistended. Normal bowel sounds. No rebound or guarding. No fluid wave.  LAD: No inguinal or umbilical LAD Extremities:  Without edema. Neurologic:  Alert and  oriented x4;  grossly normal neurologically; no asterixis or clonus. Skin: No jaundice. No palmar erythema or spider angioma.  Psych:  Alert and cooperative. Normal mood and affect.   Shayra Anton L. Tarri Glenn Md, MPH [facility] 06/23/2018, 9:10 AM

## 2018-06-23 ENCOUNTER — Encounter: Payer: Self-pay | Admitting: Gastroenterology

## 2018-06-23 ENCOUNTER — Ambulatory Visit (INDEPENDENT_AMBULATORY_CARE_PROVIDER_SITE_OTHER): Payer: 59 | Admitting: Gastroenterology

## 2018-06-23 VITALS — BP 108/72 | HR 66 | Ht 65.0 in | Wt 191.4 lb

## 2018-06-23 DIAGNOSIS — R1310 Dysphagia, unspecified: Secondary | ICD-10-CM | POA: Diagnosis not present

## 2018-06-23 DIAGNOSIS — R748 Abnormal levels of other serum enzymes: Secondary | ICD-10-CM | POA: Diagnosis not present

## 2018-06-23 DIAGNOSIS — R1013 Epigastric pain: Secondary | ICD-10-CM | POA: Diagnosis not present

## 2018-06-23 DIAGNOSIS — Z1211 Encounter for screening for malignant neoplasm of colon: Secondary | ICD-10-CM | POA: Diagnosis not present

## 2018-06-23 MED ORDER — PANTOPRAZOLE SODIUM 40 MG PO TBEC: 40.0000 mg | DELAYED_RELEASE_TABLET | Freq: Two times a day (BID) | ORAL | 3 refills | Status: DC

## 2018-06-23 NOTE — Patient Instructions (Signed)
You have been scheduled for an abdominal ultrasound at [facility] (1st floor of hospital) on 06-25-18 at 8:30 am. Please arrive 15 minutes prior to your appointment for registration. Make certain not to have anything to eat or drink 6 after midnight prior to your appointment. Should you need to reschedule your appointment, please contact radiology at [phone]. This test typically takes about 30 minutes to perform.  Your provider has requested that you go to the basement level for lab work before leaving today. Press "B" on the elevator. The lab is located at the first door on the left as you exit the elevator.  Increase Pantoprazole to 40 mg twice a day.   Avoid all NSAIDs.   Please call back at the beginning of December to schedule your procedure for January.

## 2018-06-25 ENCOUNTER — Other Ambulatory Visit
Admission: RE | Admit: 2018-06-25 | Discharge: 2018-06-25 | Disposition: A | Discharge status: Home / Self Care | Payer: 59 | Source: Ambulatory Visit | Attending: Gastroenterology | Admitting: Gastroenterology

## 2018-06-25 ENCOUNTER — Ambulatory Visit
Admission: RE | Admit: 2018-06-25 | Discharge: 2018-06-25 | Disposition: A | Discharge status: Home / Self Care | Payer: 59 | Source: Ambulatory Visit | Attending: Gastroenterology | Admitting: Gastroenterology

## 2018-06-25 DIAGNOSIS — Z1211 Encounter for screening for malignant neoplasm of colon: Secondary | ICD-10-CM | POA: Diagnosis not present

## 2018-06-25 DIAGNOSIS — R1310 Dysphagia, unspecified: Secondary | ICD-10-CM | POA: Diagnosis not present

## 2018-06-25 DIAGNOSIS — R748 Abnormal levels of other serum enzymes: Secondary | ICD-10-CM | POA: Insufficient documentation

## 2018-06-25 DIAGNOSIS — Z9049 Acquired absence of other specified parts of digestive tract: Secondary | ICD-10-CM | POA: Diagnosis not present

## 2018-06-25 DIAGNOSIS — K7689 Other specified diseases of liver: Secondary | ICD-10-CM | POA: Diagnosis not present

## 2018-06-25 LAB — GLUCOSE, RANDOM: GLUCOSE: 96 mg/dL (ref 70–99)

## 2018-06-25 LAB — FERRITIN: Ferritin: 46 ng/mL (ref 11–307)

## 2018-06-25 NOTE — Addendum Note (Signed)
Addended by: Deloria LairAYLOR, Ericka Marcellus on: 06/25/2018 08:57 AM   Modules accepted: Orders

## 2018-06-25 NOTE — Addendum Note (Signed)
Addended by: Issachar Broady on: 06/25/2018 08:57 AM   Modules accepted: Orders  

## 2018-06-25 NOTE — Addendum Note (Signed)
Addended by: Tejah Brekke on: 06/25/2018 08:57 AM   Modules accepted: Orders  

## 2018-06-25 NOTE — Addendum Note (Signed)
Addended by: Deloria LairAYLOR, Naveah Brave on: 06/25/2018 08:56 AM   Modules accepted: Orders

## 2018-06-25 NOTE — Addendum Note (Signed)
Addended by: Deloria LairAYLOR, Chelcea Zahn on: 06/25/2018 08:58 AM   Modules accepted: Orders

## 2018-06-25 NOTE — Addendum Note (Signed)
Addended by: Tristian Bouska on: 06/25/2018 08:57 AM   Modules accepted: Orders  

## 2018-06-25 NOTE — Addendum Note (Signed)
Addended by: Deloria LairAYLOR, Raushanah Osmundson on: 06/25/2018 08:55 AM   Modules accepted: Orders

## 2018-06-25 NOTE — Addendum Note (Signed)
Addended by: Lidiya Reise on: 06/25/2018 08:56 AM   Modules accepted: Orders  

## 2018-06-25 NOTE — Addendum Note (Signed)
Addended by: Willies Laviolette on: 06/25/2018 08:58 AM   Modules accepted: Orders  

## 2018-06-26 LAB — HEPATITIS B SURFACE ANTIGEN: HEP B S AG: NEGATIVE

## 2018-06-26 LAB — IGM: IGM (IMMUNOGLOBULIN M), SRM: 160 mg/dL (ref 26–217)

## 2018-06-26 LAB — ANTI-SMOOTH MUSCLE ANTIBODY, IGG: F-ACTIN AB IGG: 5 U (ref 0–19)

## 2018-06-26 LAB — HEPATITIS C ANTIBODY

## 2018-06-26 LAB — IGG: IGG (IMMUNOGLOBIN G), SERUM: 799 mg/dL (ref 700–1600)

## 2018-06-26 LAB — HEPATITIS B CORE ANTIBODY, IGM: HEP B C IGM: NEGATIVE

## 2018-06-26 LAB — ANA: ANA: NEGATIVE

## 2018-06-29 ENCOUNTER — Other Ambulatory Visit: Payer: Self-pay | Admitting: Internal Medicine

## 2018-06-29 LAB — MISC LABCORP TEST (SEND OUT): LABCORP TEST CODE: 501561

## 2018-06-30 NOTE — Telephone Encounter (Signed)
Last filled 04-29-18 #60 Last OV 05-20-18 Next OV 05-24-19 [facility]

## 2018-08-27 ENCOUNTER — Other Ambulatory Visit: Payer: Self-pay | Admitting: Internal Medicine

## 2018-08-27 NOTE — Telephone Encounter (Signed)
Last filled 06-30-18 #60 Last OV 05-20-18 Next OV 05-24-19 [facility]

## 2018-10-19 ENCOUNTER — Other Ambulatory Visit: Payer: Self-pay | Admitting: Internal Medicine

## 2018-10-19 NOTE — Telephone Encounter (Signed)
Last filled 08-27-18 #60 Last OV 05-20-18 Next OV 05-24-19 [facility]

## 2018-10-30 ENCOUNTER — Other Ambulatory Visit: Payer: Self-pay | Admitting: Internal Medicine

## 2018-10-30 NOTE — Telephone Encounter (Signed)
Last filled 04-16-18 #30 Last OV 05-20-18 Next OV 05-24-19 [facility]

## 2018-11-27 ENCOUNTER — Other Ambulatory Visit: Payer: Self-pay | Admitting: Internal Medicine

## 2018-11-27 NOTE — Telephone Encounter (Signed)
Pharmacy requests refill on patients sulfamethoxazole-trimethoprim.   Last refilled #20, 1 refill on 06/03/18 Last OV: 05/20/18 Next OV: 05/24/19 [facility]

## 2018-12-17 ENCOUNTER — Other Ambulatory Visit: Payer: Self-pay | Admitting: Internal Medicine

## 2018-12-17 NOTE — Telephone Encounter (Signed)
Last filled 10-19-18 #60 Last OV 05-20-18 Next OV 05-24-19 [facility]

## 2019-02-09 ENCOUNTER — Other Ambulatory Visit: Payer: Self-pay | Admitting: Internal Medicine

## 2019-02-09 NOTE — Telephone Encounter (Signed)
Last filled 12-17-18 #60 Last OV 05-20-18 Next OV 05-24-19 [facility]

## 2019-02-22 ENCOUNTER — Other Ambulatory Visit: Payer: Self-pay | Admitting: Internal Medicine

## 2019-04-07 ENCOUNTER — Other Ambulatory Visit: Payer: Self-pay | Admitting: Internal Medicine

## 2019-04-07 NOTE — Telephone Encounter (Signed)
Last filled 02-09-19 #60 Last OV 05-20-18 Next OV 05-24-19 [facility]

## 2019-04-20 ENCOUNTER — Other Ambulatory Visit: Payer: Self-pay | Admitting: Internal Medicine

## 2019-04-21 NOTE — Telephone Encounter (Signed)
Last filled 10-30-18 #30 Last OV 05-20-18 Next OV 05-24-19 [facility]

## 2019-05-24 ENCOUNTER — Encounter: Payer: 59 | Admitting: Internal Medicine

## 2019-06-09 ENCOUNTER — Other Ambulatory Visit: Payer: Self-pay | Admitting: Internal Medicine

## 2019-06-09 NOTE — Telephone Encounter (Signed)
Last filled 04-07-19 #60 Last OV 05-20-18 Next OV 06-16-19 [facility]

## 2019-06-16 ENCOUNTER — Ambulatory Visit: Payer: Self-pay | Admitting: Internal Medicine

## 2019-08-04 ENCOUNTER — Other Ambulatory Visit: Payer: Self-pay | Admitting: Internal Medicine

## 2019-08-04 NOTE — Telephone Encounter (Signed)
Last filled 06-09-19 #60 Last OV 05-20-18 No Future OV Cx 05-24-19 No Show 06-16-19 [facility]  Forwarding to [facility] in Dr Alla German absence

## 2019-08-04 NOTE — Telephone Encounter (Signed)
Denied. She cancelled her appt, its been > 1 year since she has been seen.

## 2019-08-13 ENCOUNTER — Other Ambulatory Visit: Payer: Self-pay | Admitting: Internal Medicine

## 2019-08-13 MED ORDER — TRAMADOL HCL 50 MG PO TABS: 50.0000 mg | ORAL_TABLET | Freq: Two times a day (BID) | ORAL | 0 refills | Status: DC | PRN

## 2019-08-13 NOTE — Addendum Note (Signed)
Addended by: Tillman Abide I on: 08/13/2019 11:36 AM   Modules accepted: Orders

## 2019-08-13 NOTE — Telephone Encounter (Signed)
Patient is requesting a call back about the denial.  She stated she is very frustrated why she cant get the refills. Her appt was cancelled because she was exposed to covid. Patient stated that no one has updated her with the denial reason and wanted to speak with the nurse   Please advise

## 2019-08-13 NOTE — Addendum Note (Signed)
Addended by: Eual Fines on: 08/13/2019 11:17 AM   Modules accepted: Orders

## 2019-08-13 NOTE — Telephone Encounter (Signed)
She has been a consistent patient over the years---I did the refill. Please have her reschedule the PE appointment

## 2019-08-13 NOTE — Telephone Encounter (Signed)
Patient's phone went straight to voice mail.  I left a message for patient to return my call.  If she returns my call, please schedule cpx.

## 2019-08-13 NOTE — Telephone Encounter (Addendum)
This was done when Dr Alphonsus Sias was out of the office. Will forward to him to see if he wants to give her a refill.

## 2019-09-16 ENCOUNTER — Encounter: Payer: Self-pay | Admitting: Internal Medicine

## 2019-09-16 ENCOUNTER — Other Ambulatory Visit: Payer: Self-pay

## 2019-09-16 ENCOUNTER — Ambulatory Visit (INDEPENDENT_AMBULATORY_CARE_PROVIDER_SITE_OTHER): Payer: 59 | Admitting: Internal Medicine

## 2019-09-16 VITALS — BP 124/76 | HR 82 | Temp 97.1°F | Ht 65.0 in | Wt 191.0 lb

## 2019-09-16 DIAGNOSIS — N951 Menopausal and female climacteric states: Secondary | ICD-10-CM | POA: Diagnosis not present

## 2019-09-16 DIAGNOSIS — K2100 Gastro-esophageal reflux disease with esophagitis, without bleeding: Secondary | ICD-10-CM | POA: Diagnosis not present

## 2019-09-16 DIAGNOSIS — Z1211 Encounter for screening for malignant neoplasm of colon: Secondary | ICD-10-CM

## 2019-09-16 DIAGNOSIS — Z0000 Encounter for general adult medical examination without abnormal findings: Secondary | ICD-10-CM | POA: Diagnosis not present

## 2019-09-16 NOTE — Assessment & Plan Note (Signed)
Healthy and plans to work better on fitness Yearly flu vaccine COVID vaccine when available Will try to set up the colonoscopy again Overdue for mammogram

## 2019-09-16 NOTE — Patient Instructions (Signed)
Please set up your screening mammogram. 

## 2019-09-16 NOTE — Assessment & Plan Note (Signed)
Seems to be better on the PPI regularly Still vague abdominal symptoms

## 2019-09-16 NOTE — Progress Notes (Signed)
Subjective:    Patient ID: Sherri Rice, female    DOB: 06/04/1966, 54 y.o.   MRN: [id number]  HPI Here for physical This visit occurred during the SARS-CoV-2 public health emergency.  Safety protocols were in place, including screening questions prior to the visit, additional usage of staff PPE, and extensive cleaning of exam room while observing appropriate contact time as indicated for disinfecting solutions.   Did go to urgent care for UTI No post coital problems lately  Dysphagia is better  On prilosec daily now Still has some vague right upper abdominal pain  Still on the estrogen Didn't do well trying to go off it---loses hair and gains weight  Has xanax for prn use Not very often  Current Outpatient Medications on File Prior to Visit  Medication Sig Dispense Refill  . ALPRAZolam (XANAX) 0.25 MG tablet TAKE 1 TABLET BY MOUTH TWICE A DAY AS NEEDED 30 tablet 0  . estradiol (ESTRACE) 0.5 MG tablet TAKE 1 TABLET BY MOUTH EVERY DAY 90 tablet 0  . omeprazole (PRILOSEC) 20 MG capsule Take 20 mg by mouth daily.    Marland Kitchen sulfamethoxazole-trimethoprim (BACTRIM DS) 800-160 MG tablet TAKE 1 TABLET AFTER INTERCOURSE AS DIRECTED 20 tablet 1  . traMADol (ULTRAM) 50 MG tablet Take 1 tablet (50 mg total) by mouth 2 (two) times daily as needed. 60 tablet 0   No current facility-administered medications on file prior to visit.    Allergies  Allergen Reactions  . Codeine     REACTION: itching    Past Medical History:  Diagnosis Date  . Degeneration of cervical intervertebral disc   . Frequent PVCs   . Gallstones   . Hyperlipidemia   . Kidney stones   . Mitral valve disorders(424.0)   . Personal history of urinary calculi   . Pneumonia     Past Surgical History:  Procedure Laterality Date  . ABDOMINAL HYSTERECTOMY    . CARDIOVASCULAR STRESS TEST  6/16   with contrast---was normal  . CHOLECYSTECTOMY    . TONSILLECTOMY    . TONSILLECTOMY      Family History  Problem  Relation Age of Onset  . Breast cancer Mother   . Colon cancer Mother   . Bipolar disorder Father   . Coronary artery disease Paternal Aunt   . Colon cancer Maternal Grandmother   . Diabetes Neg Hx   . Cancer Neg Hx     Social History   Socioeconomic History  . Marital status: Married    Spouse name: Not on file  . Number of children: 2  . Years of education: Not on file  . Highest education level: Not on file  Occupational History  . Occupation: PA    Employer: [employer]    Comment: rare disease management  Tobacco Use  . Smoking status: Former Smoker    Types: Cigarettes    Quit date: 08/05/1978    Years since quitting: 41.1  . Smokeless tobacco: Never Used  Substance and Sexual Activity  . Alcohol use: Yes  . Drug use: No  . Sexual activity: Not on file  Other Topics Concern  . Not on file  Social History Narrative  . Not on file   Social Determinants of Health   Financial Resource Strain:   . Difficulty of Paying Living Expenses: Not on file  Food Insecurity:   . Worried About Programme researcher, broadcasting/film/video in the Last Year: Not on file  . Ran Out of Food in the Last  Year: Not on file  Transportation Needs:   . Lack of Transportation (Medical): Not on file  . Lack of Transportation (Non-Medical): Not on file  Physical Activity:   . Days of Exercise per Week: Not on file  . Minutes of Exercise per Session: Not on file  Stress:   . Feeling of Stress : Not on file  Social Connections:   . Frequency of Communication with Friends and Family: Not on file  . Frequency of Social Gatherings with Friends and Family: Not on file  . Attends Religious Services: Not on file  . Active Member of Clubs or Organizations: Not on file  . Attends Archivist Meetings: Not on file  . Marital Status: Not on file  Intimate Partner Violence:   . Fear of Current or Ex-Partner: Not on file  . Emotionally Abused: Not on file  . Physically Abused: Not on file  . Sexually Abused: Not on  file   Review of Systems  Constitutional: Negative for fatigue.       Gained weight then lost it again Wears seat belt Has restarted exercise program  HENT:       Hearing loss/tinnitus in left ear---since ruptured TM Keeps up with dentist--did have broken tooth (needs crown)  Eyes: Negative for visual disturbance.       No diplopia or unilateral vision loss  Respiratory: Negative for cough, chest tightness and shortness of breath.   Cardiovascular: Negative for chest pain, palpitations and leg swelling.  Gastrointestinal: Negative for blood in stool and constipation.  Endocrine: Negative for polydipsia and polyuria.  Genitourinary: Negative for dyspareunia, dysuria and hematuria.  Musculoskeletal: Negative for arthralgias, back pain and joint swelling.  Skin: Negative for rash.       Has spot on back of left calf she needs checked  Allergic/Immunologic: Negative for environmental allergies and immunocompromised state.  Neurological: Negative for dizziness, syncope, light-headedness and headaches.  Hematological: Negative for adenopathy. Bruises/bleeds easily.  Psychiatric/Behavioral: Negative for dysphoric mood and sleep disturbance. The patient is not nervous/anxious.        Objective:   Physical Exam  Constitutional: She is oriented to person, place, and time. She appears well-developed. No distress.  HENT:  Head: Normocephalic and atraumatic.  Right Ear: External ear normal.  Left Ear: External ear normal.  Mouth/Throat: Oropharynx is clear and moist. No oropharyngeal exudate.  Eyes: Pupils are equal, round, and reactive to light. Conjunctivae are normal.  Neck: No thyromegaly present.  Cardiovascular: Normal rate, regular rhythm, normal heart sounds and intact distal pulses. Exam reveals no gallop.  No murmur heard. Respiratory: Effort normal and breath sounds normal. No respiratory distress. She has no wheezes. She has no rales.  GI: Soft. There is no abdominal  tenderness.  Musculoskeletal:        General: No tenderness or edema.  Lymphadenopathy:    She has no cervical adenopathy.  Neurological: She is alert and oriented to person, place, and time.  Skin: No rash noted. No erythema.  Benign lentigo left posterior calf  Psychiatric: She has a normal mood and affect. Her behavior is normal.           Assessment & Plan:

## 2019-09-16 NOTE — Assessment & Plan Note (Signed)
Discussed trying slow wean of the estrogen (to as low as every other day)

## 2019-09-17 LAB — COMPREHENSIVE METABOLIC PANEL
ALT: 30 U/L (ref 0–35)
AST: 32 U/L (ref 0–37)
Albumin: 4.4 g/dL (ref 3.5–5.2)
Alkaline Phosphatase: 156 U/L — ABNORMAL HIGH (ref 39–117)
BUN: 12 mg/dL (ref 6–23)
CO2: 26 mEq/L (ref 19–32)
Calcium: 9.5 mg/dL (ref 8.4–10.5)
Chloride: 102 mEq/L (ref 96–112)
Creatinine, Ser: 1.05 mg/dL (ref 0.40–1.20)
GFR: 54.27 mL/min — ABNORMAL LOW (ref >60.00)
Glucose, Bld: 84 mg/dL (ref 70–99)
Potassium: 4 mEq/L (ref 3.5–5.1)
Sodium: 138 mEq/L (ref 135–145)
Total Bilirubin: 0.5 mg/dL (ref 0.2–1.2)
Total Protein: 7.7 g/dL (ref 6.0–8.3)

## 2019-09-17 LAB — CBC
HCT: 44.8 % (ref 36.0–46.0)
Hemoglobin: 14.7 g/dL (ref 12.0–15.0)
MCHC: 32.9 g/dL (ref 30.0–36.0)
MCV: 90.8 fl (ref 78.0–100.0)
Platelets: 368 10*3/uL (ref 150.0–400.0)
RBC: 4.93 Mil/uL (ref 3.87–5.11)
RDW: 13.6 % (ref 11.5–15.5)
WBC: 9.9 10*3/uL (ref 4.0–10.5)

## 2019-09-24 ENCOUNTER — Encounter: Payer: Self-pay | Admitting: *Deleted

## 2019-09-24 ENCOUNTER — Other Ambulatory Visit: Payer: Self-pay | Admitting: Internal Medicine

## 2019-10-14 ENCOUNTER — Other Ambulatory Visit: Payer: Self-pay | Admitting: Internal Medicine

## 2019-10-14 NOTE — Telephone Encounter (Signed)
Tramadol last filled 08-13-19 #60 Alprazolam last filled 04-21-19 #30 Last OV 09-16-19 No Future OV [facility]

## 2019-10-18 ENCOUNTER — Other Ambulatory Visit: Payer: Self-pay

## 2019-10-19 ENCOUNTER — Encounter: Payer: Self-pay | Admitting: Gastroenterology

## 2019-10-20 ENCOUNTER — Ambulatory Visit (INDEPENDENT_AMBULATORY_CARE_PROVIDER_SITE_OTHER): Payer: 59 | Admitting: Gastroenterology

## 2019-10-20 ENCOUNTER — Other Ambulatory Visit: Payer: Self-pay

## 2019-10-20 ENCOUNTER — Encounter: Payer: Self-pay | Admitting: Gastroenterology

## 2019-10-20 DIAGNOSIS — Z1211 Encounter for screening for malignant neoplasm of colon: Secondary | ICD-10-CM

## 2019-10-20 DIAGNOSIS — K219 Gastro-esophageal reflux disease without esophagitis: Secondary | ICD-10-CM

## 2019-10-20 DIAGNOSIS — Z8619 Personal history of other infectious and parasitic diseases: Secondary | ICD-10-CM | POA: Diagnosis not present

## 2019-10-20 DIAGNOSIS — R109 Unspecified abdominal pain: Secondary | ICD-10-CM | POA: Diagnosis not present

## 2019-10-20 DIAGNOSIS — Z23 Encounter for immunization: Secondary | ICD-10-CM | POA: Diagnosis not present

## 2019-10-20 DIAGNOSIS — R1310 Dysphagia, unspecified: Secondary | ICD-10-CM

## 2019-10-20 MED ORDER — NA SULFATE-K SULFATE-MG SULF 17.5-3.13-1.6 GM/177ML PO SOLN: 354.0000 mL | Freq: Once | ORAL | 0 refills | Status: AC

## 2019-10-20 MED ORDER — RABEPRAZOLE SODIUM 20 MG PO TBEC: 20.0000 mg | DELAYED_RELEASE_TABLET | Freq: Every day | ORAL | 0 refills | Status: DC

## 2019-10-20 NOTE — Progress Notes (Signed)
sup

## 2019-10-20 NOTE — Patient Instructions (Signed)

## 2019-10-21 NOTE — Progress Notes (Signed)
Sherri Rice  [place], [place] [address]  Main: [phone]  Fax: [phone]   Gastroenterology Consultation  Referring Provider:     Venia Carbon, MD Primary Care Physician:  Venia Carbon, MD Reason for Consultation:     GERD        HPI:   Virtual Visit via Video Note  I connected with patient on 10/21/19 at  1:45 PM EDT by video ([email or web address]) and verified that I am speaking with the correct person using two identifiers.   I discussed the limitations, risks, security and privacy concerns of performing an evaluation and management service by video and the availability of in person appointments. I also discussed with the patient that there may be a patient responsible charge related to this service. The patient expressed understanding and agreed to proceed.  Location of the patient: Home Location of provider: Home Participating persons: Patient and provider only (Nursing staff checked in patient via phone but were not physically involved in the video interaction - see their notes)   History of Present Illness: Chief Complaint  Patient presents with  . New Patient (Initial Visit)  . Gastroesophageal Reflux    Patient is having burning in throat, chest pressure, history of ulcer, nausea, epigastric abdominal pain.   . Abdominal Pain    Patient is having RLQ pain. Patient states this comes and goes.     Poppi Rice is a 54 y.o. y/o female referred for consultation & management  by Dr. Venia Carbon, MD.  Patient with chronic history of reflux, chronically on PPI with recent exacerbation of symptoms.  Is currently on Tagamet which helps her symptoms but states AcipHex is what has helped her the most previously.  It was not covered by her insurance before and therefore it was changed.  She is also reporting intermittent dysphagia to rice specifically.  No episodes of food impaction.  No weight loss.  Has has had upper endoscopy  before in [place] and we do not have the records of the same.  Also has history of H. pylori that was treated with antibiotics in the past    Past Medical History:  Diagnosis Date  . Degeneration of cervical intervertebral disc   . Frequent PVCs   . Gallstones   . Hyperlipidemia   . Kidney stones   . Mitral valve disorders(424.0)   . Personal history of urinary calculi   . Pneumonia     Past Surgical History:  Procedure Laterality Date  . ABDOMINAL HYSTERECTOMY    . CARDIOVASCULAR STRESS TEST  6/16   with contrast---was normal  . CHOLECYSTECTOMY    . TONSILLECTOMY    . TONSILLECTOMY      Prior to Admission medications   Medication Sig Start Date End Date Taking? Authorizing Provider  ALPRAZolam Duanne Moron) 0.25 MG tablet TAKE 1 TABLET BY MOUTH TWICE A DAY AS NEEDED 10/14/19  Yes Viviana Simpler I, MD  estradiol (ESTRACE) 0.5 MG tablet TAKE 1 TABLET BY MOUTH EVERY DAY 06/09/19  Yes Venia Carbon, MD  sulfamethoxazole-trimethoprim (BACTRIM DS) 800-160 MG tablet TAKE 1 TABLET AFTER INTERCOURSE AS DIRECTED 09/24/19  Yes Venia Carbon, MD  traMADol (ULTRAM) 50 MG tablet TAKE 1 TABLET BY MOUTH 2 TIMES DAILY AS NEEDED. 10/14/19  Yes Venia Carbon, MD  RABEprazole (ACIPHEX) 20 MG tablet Take 1 tablet (20 mg total) by mouth daily. 10/20/19 11/19/19  Virgel Manifold, MD    Family History  Problem Relation Age of Onset  . Breast cancer Mother   . Colon cancer Mother   . Bipolar disorder Father   . Coronary artery disease Paternal Aunt   . Colon cancer Maternal Grandmother   . Diabetes Neg Hx   . Cancer Neg Hx      Social History   Tobacco Use  . Smoking status: Former Smoker    Types: Cigarettes    Quit date: 08/05/1978    Years since quitting: 41.2  . Smokeless tobacco: Never Used  Substance Use Topics  . Alcohol use: Not Currently  . Drug use: No    Allergies as of 10/20/2019 - Review Complete 10/20/2019  Allergen Reaction Noted  . Codeine  05/25/2010     Review of Systems:    All systems reviewed and negative except where noted in HPI.   Observations/Objective:  Labs: CBC    Component Value Date/Time   WBC 9.9 09/16/2019 1705   RBC 4.93 09/16/2019 1705   HGB 14.7 09/16/2019 1705   HCT 44.8 09/16/2019 1705   PLT 368.0 09/16/2019 1705   MCV 90.8 09/16/2019 1705   MCH 30.5 06/03/2015 1315   MCHC 32.9 09/16/2019 1705   RDW 13.6 09/16/2019 1705   LYMPHSABS 1.9 10/29/2016 0923   MONOABS 0.4 10/29/2016 0923   EOSABS 0.1 10/29/2016 0923   BASOSABS 0.1 10/29/2016 0923   CMP     Component Value Date/Time   NA 138 09/16/2019 1705   K 4.0 09/16/2019 1705   CL 102 09/16/2019 1705   CO2 26 09/16/2019 1705   GLUCOSE 84 09/16/2019 1705   BUN 12 09/16/2019 1705   CREATININE 1.05 09/16/2019 1705   CALCIUM 9.5 09/16/2019 1705   PROT 7.7 09/16/2019 1705   ALBUMIN 4.4 09/16/2019 1705   AST 32 09/16/2019 1705   ALT 30 09/16/2019 1705   ALKPHOS 156 (H) 09/16/2019 1705   BILITOT 0.5 09/16/2019 1705   GFRNONAA 55 (L) 06/03/2015 1315   GFRAA >60 06/03/2015 1315    Imaging Studies: No results found.  Assessment and Plan:   Sherri Rice is a 54 y.o. y/o female has been referred for GERD  Assessment and Plan: Since AcipHex has helped her symptoms the most in the past, and patient is well versed with her symptoms and medications given her chronic history and multiple PPIs in the past, will try to send AcipHex to the pharmacy to see if her insurance covers it.  Otherwise she is on Tagamet  Due to intermittent dysphagia, EGD indicated for further evaluation to obtain biopsies for EOE and rule out strictures.  Evaluate for Barrett's as well given chronic reflux.  Also reporting vague abdominal discomfort and has history of H. pylori in the past.  Obtain gastric biopsies  Patient educated extensively on acid reflux lifestyle modification, including buying a bed wedge, not eating 3 hrs before bedtime, diet modifications, and handout  given for the same.   (Risks of PPI use were discussed with patient including bone loss, C. Diff diarrhea, pneumonia, infections, CKD, electrolyte abnormalities.  Pt. Verbalizes understanding and chooses to continue the medication.)  Patient also due for screening colonoscopy, schedule along with EGD   Follow Up Instructions:   I discussed the assessment and treatment plan with the patient. The patient was provided an opportunity to ask questions and all were answered. The patient agreed with the plan and demonstrated an understanding of the instructions.   The patient was advised to call back or seek an in-person  evaluation if the symptoms worsen or if the condition fails to improve as anticipated.  I provided 15 minutes of face-to-face time via video software during this encounter.  Additional time was spent in reviewing patient's chart, placing orders etc.   Pasty Spillers, MD  Speech recognition software was used to dictate the above note.

## 2019-11-06 ENCOUNTER — Other Ambulatory Visit: Payer: Self-pay | Admitting: Internal Medicine

## 2019-11-13 ENCOUNTER — Other Ambulatory Visit: Payer: Self-pay | Admitting: Gastroenterology

## 2019-11-15 NOTE — Telephone Encounter (Signed)
Last office visit 10/20/2019 GERD  Last refill 10/20/2019 0 refills

## 2019-11-17 ENCOUNTER — Other Ambulatory Visit
Admission: RE | Admit: 2019-11-17 | Discharge: 2019-11-17 | Disposition: A | Discharge status: Home / Self Care | Payer: 59 | Source: Ambulatory Visit | Attending: Gastroenterology | Admitting: Gastroenterology

## 2019-11-17 ENCOUNTER — Other Ambulatory Visit: Payer: Self-pay

## 2019-11-17 DIAGNOSIS — Z01812 Encounter for preprocedural laboratory examination: Secondary | ICD-10-CM | POA: Diagnosis not present

## 2019-11-17 DIAGNOSIS — Z20822 Contact with and (suspected) exposure to covid-19: Secondary | ICD-10-CM | POA: Diagnosis not present

## 2019-11-17 LAB — SARS CORONAVIRUS 2 (TAT 6-24 HRS): SARS Coronavirus 2: NEGATIVE

## 2019-11-19 ENCOUNTER — Encounter: Admission: RE | Payer: Self-pay | Source: Home / Self Care

## 2019-11-19 ENCOUNTER — Ambulatory Visit: Payer: No Typology Code available for payment source | Admitting: Certified Registered Nurse Anesthetist

## 2019-11-19 ENCOUNTER — Encounter: Payer: Self-pay | Admitting: Gastroenterology

## 2019-11-19 ENCOUNTER — Encounter
Admission: RE | Disposition: A | Discharge status: Home / Self Care | Payer: Self-pay | Source: Ambulatory Visit | Attending: Gastroenterology

## 2019-11-19 ENCOUNTER — Ambulatory Visit
Admission: RE | Admit: 2019-11-19 | Discharge: 2019-11-19 | Disposition: A | Discharge status: Home / Self Care | Payer: No Typology Code available for payment source | Source: Ambulatory Visit | Attending: Gastroenterology | Admitting: Gastroenterology

## 2019-11-19 DIAGNOSIS — Z87891 Personal history of nicotine dependence: Secondary | ICD-10-CM | POA: Insufficient documentation

## 2019-11-19 DIAGNOSIS — Z79890 Hormone replacement therapy (postmenopausal): Secondary | ICD-10-CM | POA: Insufficient documentation

## 2019-11-19 DIAGNOSIS — Z1211 Encounter for screening for malignant neoplasm of colon: Secondary | ICD-10-CM | POA: Diagnosis not present

## 2019-11-19 DIAGNOSIS — K640 First degree hemorrhoids: Secondary | ICD-10-CM | POA: Diagnosis not present

## 2019-11-19 DIAGNOSIS — Z8619 Personal history of other infectious and parasitic diseases: Secondary | ICD-10-CM

## 2019-11-19 DIAGNOSIS — K449 Diaphragmatic hernia without obstruction or gangrene: Secondary | ICD-10-CM | POA: Diagnosis not present

## 2019-11-19 DIAGNOSIS — K222 Esophageal obstruction: Secondary | ICD-10-CM | POA: Insufficient documentation

## 2019-11-19 DIAGNOSIS — R1310 Dysphagia, unspecified: Secondary | ICD-10-CM | POA: Diagnosis not present

## 2019-11-19 DIAGNOSIS — Z885 Allergy status to narcotic agent status: Secondary | ICD-10-CM | POA: Diagnosis not present

## 2019-11-19 DIAGNOSIS — Z79899 Other long term (current) drug therapy: Secondary | ICD-10-CM | POA: Diagnosis not present

## 2019-11-19 DIAGNOSIS — R109 Unspecified abdominal pain: Secondary | ICD-10-CM

## 2019-11-19 DIAGNOSIS — K219 Gastro-esophageal reflux disease without esophagitis: Secondary | ICD-10-CM

## 2019-11-19 HISTORY — PX: COLONOSCOPY WITH PROPOFOL: SHX5780

## 2019-11-19 HISTORY — PX: ESOPHAGOGASTRODUODENOSCOPY (EGD) WITH PROPOFOL: SHX5813

## 2019-11-19 SURGERY — COLONOSCOPY WITH PROPOFOL
Anesthesia: General

## 2019-11-19 MED ORDER — SODIUM CHLORIDE 0.9 % IV SOLN
INTRAVENOUS | Status: DC
  Administered 2019-11-19: 09:00:00 1000 mL via INTRAVENOUS

## 2019-11-19 MED ORDER — PROPOFOL 10 MG/ML IV BOLUS
INTRAVENOUS | Status: DC | PRN
  Administered 2019-11-19: 30 mg via INTRAVENOUS
  Administered 2019-11-19 (×2): 20 mg via INTRAVENOUS
  Administered 2019-11-19: 30 mg via INTRAVENOUS
  Administered 2019-11-19: 70 mg via INTRAVENOUS

## 2019-11-19 MED ORDER — LIDOCAINE HCL (CARDIAC) PF 100 MG/5ML IV SOSY
PREFILLED_SYRINGE | INTRAVENOUS | Status: DC | PRN
  Administered 2019-11-19: 50 mg via INTRAVENOUS

## 2019-11-19 MED ORDER — PROPOFOL 500 MG/50ML IV EMUL
INTRAVENOUS | Status: AC
  Filled 2019-11-19: qty 50

## 2019-11-19 MED ORDER — PROPOFOL 500 MG/50ML IV EMUL
INTRAVENOUS | Status: DC | PRN
  Administered 2019-11-19: 150 ug/kg/min via INTRAVENOUS

## 2019-11-19 NOTE — Transfer of Care (Signed)
Immediate Anesthesia Transfer of Care Note  Patient: Faun Mcqueen  Procedure(s) Performed: COLONOSCOPY WITH PROPOFOL (N/A ) ESOPHAGOGASTRODUODENOSCOPY (EGD) WITH PROPOFOL (N/A )  Patient Location: PACU  Anesthesia Type:General  Level of Consciousness: awake and alert   Airway & Oxygen Therapy: Patient Spontanous Breathing  Post-op Assessment: Report given to RN and Post -op Vital signs reviewed and stable  Post vital signs: Reviewed and stable  Last Vitals:  Vitals Value Taken Time  BP 100/66 11/19/19 1002  Temp 35.7 C 11/19/19 0959  Pulse 92 11/19/19 1002  Resp 12 11/19/19 1002  SpO2 100 % 11/19/19 1002    Last Pain:  Vitals:   11/19/19 0959  TempSrc:   PainSc: Asleep         Complications: No apparent anesthesia complications

## 2019-11-19 NOTE — Op Note (Signed)
[facility] Gastroenterology Patient Name: Sherri Rice Procedure Date: 11/19/2019 9:28 AM MRN: [id number] Account #: [id number] Date of Birth: [birth year] Admit Type: Outpatient Age: 54 Room: [facility] ENDO ROOM 4 Gender: Female Note Status: Finalized Procedure:             Upper GI endoscopy Indications:           Dysphagia Providers:             Midge Minium MD, MD Referring MD:          Karie Schwalbe (Referring MD) Medicines:             Propofol per Anesthesia Complications:         No immediate complications. Procedure:             Pre-Anesthesia Assessment:                        - Prior to the procedure, a History and Physical was                         performed, and patient medications and allergies were                         reviewed. The patient's tolerance of previous                         anesthesia was also reviewed. The risks and benefits                         of the procedure and the sedation options and risks                         were discussed with the patient. All questions were                         answered, and informed consent was obtained. Prior                         Anticoagulants: The patient has taken no previous                         anticoagulant or antiplatelet agents. ASA Grade                         Assessment: II - A patient with mild systemic disease.                         After reviewing the risks and benefits, the patient                         was deemed in satisfactory condition to undergo the                         procedure.                        After obtaining informed consent, the endoscope was  passed under direct vision. Throughout the procedure,                         the patient's blood pressure, pulse, and oxygen                         saturations were monitored continuously. The Endoscope                         was introduced through the mouth, and advanced to the                          second part of duodenum. The upper GI endoscopy was                         accomplished without difficulty. The patient tolerated                         the procedure well. Findings:      A medium-sized hiatal hernia was present.      One benign-appearing, intrinsic mild stenosis was found at the       gastroesophageal junction. The stenosis was traversed. A TTS dilator was       passed through the scope. Dilation with a 15-16.5-18 mm balloon dilator       was performed to 18 mm. The dilation site was examined following       endoscope reinsertion and showed complete resolution of luminal       narrowing.      The stomach was normal.      The examined duodenum was normal. Impression:            - Medium-sized hiatal hernia.                        - Benign-appearing esophageal stenosis. Dilated.                        - Normal stomach.                        - Normal examined duodenum.                        - No specimens collected. Recommendation:        - Discharge patient to home.                        - Resume previous diet.                        - Continue present medications.                        - Await pathology results.                        - Perform a colonoscopy today. Procedure Code(s):     --- Professional ---                        [phone], Esophagogastroduodenoscopy, flexible,  transoral; with transendoscopic balloon dilation of                         esophagus (less than 30 mm diameter) Diagnosis Code(s):     --- Professional ---                        R13.10, Dysphagia, unspecified                        K22.2, Esophageal obstruction CPT copyright 2019 American Medical Association. All rights reserved. The codes documented in this report are preliminary and upon coder review may  be revised to meet current compliance requirements. Lucilla Lame MD, MD 11/19/2019 9:43:03 AM This report has been signed  electronically. Number of Addenda: 0 Note Initiated On: 11/19/2019 9:28 AM Estimated Blood Loss:  Estimated blood loss: none.      [facility]

## 2019-11-19 NOTE — Anesthesia Preprocedure Evaluation (Signed)
Anesthesia Evaluation  Patient identified by MRN, date of birth, ID band  Reviewed: Allergy & Precautions, NPO status , Patient's Chart, lab work & pertinent test results  History of Anesthesia Complications Negative for: history of anesthetic complications  Airway Mallampati: III       Dental  (+) Chipped,    Pulmonary neg sleep apnea, neg COPD, Not current smoker, former smoker,           Cardiovascular (-) hypertension(-) Past MI and (-) CHF (-) dysrhythmias + Valvular Problems/Murmurs MVP      Neuro/Psych neg Seizures    GI/Hepatic Neg liver ROS, neg GERD  ,  Endo/Other  neg diabetes  Renal/GU Renal disease (stones)     Musculoskeletal   Abdominal   Peds  Hematology   Anesthesia Other Findings   Reproductive/Obstetrics                            Anesthesia Physical Anesthesia Plan  ASA: II  Anesthesia Plan: General   Post-op Pain Management:    Induction: Intravenous  PONV Risk Score and Plan: 3 and Propofol infusion, TIVA and Treatment may vary due to age or medical condition  Airway Management Planned: Nasal Cannula  Additional Equipment:   Intra-op Plan:   Post-operative Plan:   Informed Consent: I have reviewed the patients History and Physical, chart, labs and discussed the procedure including the risks, benefits and alternatives for the proposed anesthesia with the patient or authorized representative who has indicated his/her understanding and acceptance.       Plan Discussed with:   Anesthesia Plan Comments:         Anesthesia Quick Evaluation

## 2019-11-19 NOTE — Anesthesia Procedure Notes (Signed)
Date/Time: 11/19/2019 9:35 AM Performed by: Ginger Carne, CRNA Pre-anesthesia Checklist: Emergency Drugs available, Patient identified, Suction available, Patient being monitored and Timeout performed Patient Re-evaluated:Patient Re-evaluated prior to induction Oxygen Delivery Method: Nasal cannula Preoxygenation: Pre-oxygenation with 100% oxygen Induction Type: IV induction

## 2019-11-19 NOTE — H&P (Signed)
Midge Minium, MD [facility] [address], [address] [place], [place] [address] [phone] Fax : [phone]  Primary Care Physician:  Karie Schwalbe, MD Primary Gastroenterologist:  Dr. Servando Snare  Pre-Procedure History & Physical: HPI:  Sherri Rice is a 54 y.o. female is here for an endoscopy and colonoscopy.   Past Medical History:  Diagnosis Date  . Degeneration of cervical intervertebral disc   . Frequent PVCs   . Gallstones   . Hyperlipidemia   . Kidney stones   . Mitral valve disorders(424.0)   . Personal history of urinary calculi   . Pneumonia     Past Surgical History:  Procedure Laterality Date  . ABDOMINAL HYSTERECTOMY    . CARDIOVASCULAR STRESS TEST  6/16   with contrast---was normal  . CHOLECYSTECTOMY    . TONSILLECTOMY    . TONSILLECTOMY      Prior to Admission medications   Medication Sig Start Date End Date Taking? Authorizing Provider  ALPRAZolam Prudy Feeler) 0.25 MG tablet TAKE 1 TABLET BY MOUTH TWICE A DAY AS NEEDED 10/14/19   Tillman Abide I, MD  estradiol (ESTRACE) 0.5 MG tablet TAKE 1 TABLET BY MOUTH EVERY DAY 11/08/19   Karie Schwalbe, MD  RABEprazole (ACIPHEX) 20 MG tablet TAKE 1 TABLET BY MOUTH EVERY DAY 11/15/19   Pasty Spillers, MD  sulfamethoxazole-trimethoprim (BACTRIM DS) 800-160 MG tablet TAKE 1 TABLET AFTER INTERCOURSE AS DIRECTED 09/24/19   Karie Schwalbe, MD  traMADol (ULTRAM) 50 MG tablet TAKE 1 TABLET BY MOUTH 2 TIMES DAILY AS NEEDED. 10/14/19   Karie Schwalbe, MD    Allergies as of 11/19/2019 - Review Complete 11/19/2019  Allergen Reaction Noted  . Codeine  05/25/2010    Family History  Problem Relation Age of Onset  . Breast cancer Mother   . Colon cancer Mother   . Bipolar disorder Father   . Coronary artery disease Paternal Aunt   . Colon cancer Maternal Grandmother   . Diabetes Neg Hx   . Cancer Neg Hx     Social History   Socioeconomic History  . Marital status: Married    Spouse name: Not on file  .  Number of children: 2  . Years of education: Not on file  . Highest education level: Not on file  Occupational History  . Occupation: PA    Employer: [employer]    Comment: rare disease management  Tobacco Use  . Smoking status: Former Smoker    Types: Cigarettes    Quit date: 08/05/1978    Years since quitting: 41.3  . Smokeless tobacco: Never Used  Substance and Sexual Activity  . Alcohol use: Not Currently  . Drug use: No  . Sexual activity: Not on file  Other Topics Concern  . Not on file  Social History Narrative  . Not on file   Social Determinants of Health   Financial Resource Strain:   . Difficulty of Paying Living Expenses:   Food Insecurity:   . Worried About Programme researcher, broadcasting/film/video in the Last Year:   . Barista in the Last Year:   Transportation Needs:   . Freight forwarder (Medical):   Marland Kitchen Lack of Transportation (Non-Medical):   Physical Activity:   . Days of Exercise per Week:   . Minutes of Exercise per Session:   Stress:   . Feeling of Stress :   Social Connections:   . Frequency of Communication with Friends and Family:   . Frequency of  Social Gatherings with Friends and Family:   . Attends Religious Services:   . Active Member of Clubs or Organizations:   . Attends Archivist Meetings:   Marland Kitchen Marital Status:   Intimate Partner Violence:   . Fear of Current or Ex-Partner:   . Emotionally Abused:   Marland Kitchen Physically Abused:   . Sexually Abused:     Review of Systems: See HPI, otherwise negative ROS  Physical Exam: BP 118/79   Pulse 77   Temp (!) 97 F (36.1 C) (Tympanic)   Resp 16   Ht 5\' 5"  (1.651 m)   Wt 84.4 kg   SpO2 100%   BMI 30.95 kg/m  General:   Alert,  pleasant and cooperative in NAD Head:  Normocephalic and atraumatic. Neck:  Supple; no masses or thyromegaly. Lungs:  Clear throughout to auscultation.    Heart:  Regular rate and rhythm. Abdomen:  Soft, nontender and nondistended. Normal bowel sounds, without guarding, and  without rebound.   Neurologic:  Alert and  oriented x4;  grossly normal neurologically.  Impression/Plan: Sherri Rice is here for an endoscopy and colonoscopy to be performed for screening and dysphagia  Risks, benefits, limitations, and alternatives regarding  endoscopy and colonoscopy have been reviewed with the patient.  Questions have been answered.  All parties agreeable.   Lucilla Lame, MD  11/19/2019, 9:27 AM

## 2019-11-19 NOTE — Anesthesia Postprocedure Evaluation (Signed)
Anesthesia Post Note  Patient: Sherri Rice  Procedure(s) Performed: COLONOSCOPY WITH PROPOFOL (N/A ) ESOPHAGOGASTRODUODENOSCOPY (EGD) WITH PROPOFOL (N/A )  Patient location during evaluation: Endoscopy Anesthesia Type: General Level of consciousness: awake and alert Pain management: pain level controlled Vital Signs Assessment: post-procedure vital signs reviewed and stable Respiratory status: spontaneous breathing and respiratory function stable Cardiovascular status: stable Anesthetic complications: no     Last Vitals:  Vitals:   11/19/19 1000 11/19/19 1002  BP:  100/66  Pulse: 91 92  Resp: 13 12  Temp:    SpO2: 100% 100%    Last Pain:  Vitals:   11/19/19 0959  TempSrc:   PainSc: Asleep                 Charma Mocarski K

## 2019-11-19 NOTE — Op Note (Signed)
[facility] Gastroenterology Patient Name: Sherri Rice Procedure Date: 11/19/2019 9:27 AM MRN: [id number] Account #: [id number] Date of Birth: 1966-03-27 Admit Type: Outpatient Age: 54 Room: [facility] ENDO ROOM 4 Gender: Female Note Status: Finalized Procedure:             Colonoscopy Indications:           Screening for colorectal malignant neoplasm Providers:             Midge Minium MD, MD Referring MD:          Karie Schwalbe (Referring MD) Medicines:             Propofol per Anesthesia Complications:         No immediate complications. Procedure:             Pre-Anesthesia Assessment:                        - Prior to the procedure, a History and Physical was                         performed, and patient medications and allergies were                         reviewed. The patient's tolerance of previous                         anesthesia was also reviewed. The risks and benefits                         of the procedure and the sedation options and risks                         were discussed with the patient. All questions were                         answered, and informed consent was obtained. Prior                         Anticoagulants: The patient has taken no previous                         anticoagulant or antiplatelet agents. ASA Grade                         Assessment: II - A patient with mild systemic disease.                         After reviewing the risks and benefits, the patient                         was deemed in satisfactory condition to undergo the                         procedure.                        After obtaining informed consent, the colonoscope was  passed under direct vision. Throughout the procedure,                         the patient's blood pressure, pulse, and oxygen                         saturations were monitored continuously. The                         Colonoscope was introduced through the  anus and                         advanced to the the cecum, identified by appendiceal                         orifice and ileocecal valve. The colonoscopy was                         performed without difficulty. The patient tolerated                         the procedure well. The quality of the bowel                         preparation was excellent. Findings:      The perianal and digital rectal examinations were normal.      Non-bleeding internal hemorrhoids were found during retroflexion. The       hemorrhoids were Grade I (internal hemorrhoids that do not prolapse). Impression:            - Non-bleeding internal hemorrhoids.                        - No specimens collected. Recommendation:        - Discharge patient to home.                        - Resume previous diet.                        - Continue present medications.                        - Repeat colonoscopy in 10 years for screening unless                         any change in family history or lower GI problems. Procedure Code(s):     --- Professional ---                        [phone], Colonoscopy, flexible; diagnostic, including                         collection of specimen(s) by brushing or washing, when                         performed (separate procedure) Diagnosis Code(s):     --- Professional ---                        Z12.11, Encounter for  screening for malignant neoplasm                         of colon CPT copyright 2019 American Medical Association. All rights reserved. The codes documented in this report are preliminary and upon coder review may  be revised to meet current compliance requirements. Lucilla Lame MD, MD 11/19/2019 9:56:25 AM This report has been signed electronically. Number of Addenda: 0 Note Initiated On: 11/19/2019 9:27 AM Scope Withdrawal Time: 0 hours 8 minutes 2 seconds  Total Procedure Duration: 0 hours 10 minutes 52 seconds  Estimated Blood Loss:  Estimated blood loss: none.       [facility]

## 2019-11-22 ENCOUNTER — Encounter: Payer: Self-pay | Admitting: *Deleted

## 2019-11-22 DIAGNOSIS — Z23 Encounter for immunization: Secondary | ICD-10-CM | POA: Diagnosis not present

## 2019-11-24 ENCOUNTER — Ambulatory Visit: Admission: RE | Admit: 2019-11-24 | Payer: 59 | Source: Home / Self Care | Admitting: Gastroenterology

## 2019-11-25 ENCOUNTER — Ambulatory Visit: Payer: 59 | Admitting: Gastroenterology

## 2019-11-29 ENCOUNTER — Other Ambulatory Visit: Payer: Self-pay | Admitting: Gastroenterology

## 2019-11-29 NOTE — Telephone Encounter (Signed)
Last office visit 10/20/2019 History  of Helicobacter pylori infection gerd  Last refill 11/15/2019 0 refills  has appointment 12/15/2019

## 2019-12-13 ENCOUNTER — Other Ambulatory Visit: Payer: Self-pay | Admitting: Internal Medicine

## 2019-12-13 ENCOUNTER — Other Ambulatory Visit: Payer: Self-pay | Admitting: Gastroenterology

## 2019-12-13 NOTE — Telephone Encounter (Signed)
Last filled 10-14-19 #60 Last OV 09-16-19 No Future OV [facility]

## 2019-12-15 ENCOUNTER — Ambulatory Visit: Payer: 59 | Admitting: Gastroenterology

## 2020-01-10 ENCOUNTER — Telehealth: Payer: Self-pay

## 2020-01-10 NOTE — Telephone Encounter (Signed)
Patient was calling to know if she could get a refill on her Raneprazole 20 MG since it constinues to help with her GERD. Please let me know if I could send a refill and additional refills.

## 2020-01-11 MED ORDER — RABEPRAZOLE SODIUM 20 MG PO TBEC: 20.0000 mg | DELAYED_RELEASE_TABLET | Freq: Every day | ORAL | 1 refills | Status: DC

## 2020-02-08 ENCOUNTER — Other Ambulatory Visit: Payer: Self-pay | Admitting: Internal Medicine

## 2020-02-09 NOTE — Telephone Encounter (Signed)
Tramadol last filled 12-13-19 #60 Alprazolam last filled 10-14-19 #30 Last OV 09-16-19 No Future OV [facility]

## 2020-04-12 ENCOUNTER — Telehealth: Payer: Self-pay | Admitting: Internal Medicine

## 2020-04-12 NOTE — Telephone Encounter (Signed)
Tramadol last filled 02-09-20 #60 Last OV 09-16-19 No Future OV [facility]

## 2020-04-13 NOTE — Telephone Encounter (Signed)
Spoke to pt. PA Approved for 6 months.

## 2020-04-13 NOTE — Telephone Encounter (Signed)
Pt called wanting to make sure you received prior [facility] from [facility]  Please advise [facility] number [phone]  Pt is out of meds

## 2020-04-19 ENCOUNTER — Other Ambulatory Visit: Payer: Self-pay | Admitting: Internal Medicine

## 2020-06-12 ENCOUNTER — Other Ambulatory Visit: Payer: Self-pay | Admitting: Internal Medicine

## 2020-07-02 ENCOUNTER — Other Ambulatory Visit: Payer: Self-pay | Admitting: Gastroenterology

## 2020-08-14 ENCOUNTER — Other Ambulatory Visit: Payer: Self-pay | Admitting: Internal Medicine

## 2020-08-15 NOTE — Telephone Encounter (Signed)
Last filled 06-13-20 #60 Last OV 06-13-20 No Future OV [facility]

## 2020-09-14 ENCOUNTER — Other Ambulatory Visit: Payer: Self-pay | Admitting: Internal Medicine

## 2020-09-14 ENCOUNTER — Telehealth: Payer: Self-pay | Admitting: Internal Medicine

## 2020-09-14 MED ORDER — SULFAMETHOXAZOLE-TRIMETHOPRIM 800-160 MG PO TABS: ORAL_TABLET | ORAL | 1 refills | Status: DC

## 2020-09-14 NOTE — Telephone Encounter (Signed)
Please let her know that I sent the refill for her

## 2020-09-14 NOTE — Telephone Encounter (Signed)
Pt called in wanted to know about the refill and she was told use to everytime she has intercourse. Its for sulfamethoxazole-trimethoprim (BACTRIM DS) 800-160 MG tablet

## 2020-09-18 DIAGNOSIS — Z20822 Contact with and (suspected) exposure to covid-19: Secondary | ICD-10-CM | POA: Diagnosis not present

## 2020-10-16 ENCOUNTER — Other Ambulatory Visit: Payer: Self-pay | Admitting: Internal Medicine

## 2020-10-16 NOTE — Telephone Encounter (Signed)
Last filled 08-16-20 #60 Last OV 06-13-20 Next OV 10-20-20 [facility]

## 2020-10-20 ENCOUNTER — Ambulatory Visit (INDEPENDENT_AMBULATORY_CARE_PROVIDER_SITE_OTHER): Payer: 59 | Admitting: Internal Medicine

## 2020-10-20 ENCOUNTER — Encounter: Payer: Self-pay | Admitting: Internal Medicine

## 2020-10-20 ENCOUNTER — Other Ambulatory Visit: Payer: Self-pay

## 2020-10-20 DIAGNOSIS — N951 Menopausal and female climacteric states: Secondary | ICD-10-CM

## 2020-10-20 DIAGNOSIS — Z0000 Encounter for general adult medical examination without abnormal findings: Secondary | ICD-10-CM | POA: Diagnosis not present

## 2020-10-20 DIAGNOSIS — K2100 Gastro-esophageal reflux disease with esophagitis, without bleeding: Secondary | ICD-10-CM | POA: Diagnosis not present

## 2020-10-20 DIAGNOSIS — M5030 Other cervical disc degeneration, unspecified cervical region: Secondary | ICD-10-CM

## 2020-10-20 MED ORDER — ALPRAZOLAM 0.25 MG PO TABS: 0.2500 mg | ORAL_TABLET | Freq: Two times a day (BID) | ORAL | 0 refills | Status: DC | PRN

## 2020-10-20 NOTE — Assessment & Plan Note (Signed)
Doing well with every other day estradiol

## 2020-10-20 NOTE — Assessment & Plan Note (Signed)
Better now with lifestyle change Uses the aciphex prn only

## 2020-10-20 NOTE — Progress Notes (Signed)
Subjective:    Patient ID: Sherri Rice, female    DOB: 05-28-1966, 55 y.o.   MRN: [id number]  HPI Here for physical This visit occurred during the SARS-CoV-2 public health emergency.  Safety protocols were in place, including screening questions prior to the visit, additional usage of staff PPE, and extensive cleaning of exam room while observing appropriate contact time as indicated for disinfecting solutions.   Doing okay Same job   Ongoing GI issues Gave up sodas, not eating late, walking more---- symptoms are better Gained a lot of weight--eating a lot of carbs Now has lost 15# of it though Feels "better than I have in 3 years" Uses the aciphex prn (it changes bowel habits)--average 2/week Occasional tums  Did cut back on the estrogen Tolerating every other day---had symptoms when she went lower Still uses the bactrim after intercourse  Takes 1 tramadol every morning  For her neck Didn't tolerate stopping this Rarely uses the xanax  Current Outpatient Medications on File Prior to Visit  Medication Sig Dispense Refill  . ALPRAZolam (XANAX) 0.25 MG tablet TAKE 1 TABLET BY MOUTH TWICE A DAY AS NEEDED 30 tablet 0  . estradiol (ESTRACE) 0.5 MG tablet TAKE 1 TABLET BY MOUTH EVERY DAY 90 tablet 3  . RABEprazole (ACIPHEX) 20 MG tablet TAKE 1 TABLET BY MOUTH EVERY DAY 90 tablet 1  . sulfamethoxazole-trimethoprim (BACTRIM DS) 800-160 MG tablet TAKE 1 TABLET AFTER INTERCOURSE AS DIRECTED 20 tablet 1  . traMADol (ULTRAM) 50 MG tablet TAKE 1 TABLET BY MOUTH TWICE A DAY AS NEEDED 60 tablet 0   No current facility-administered medications on file prior to visit.    Allergies  Allergen Reactions  . Codeine     REACTION: itching    Past Medical History:  Diagnosis Date  . Degeneration of cervical intervertebral disc   . Frequent PVCs   . Gallstones   . Hyperlipidemia   . Kidney stones   . Mitral valve disorders(424.0)   . Personal history of urinary calculi   . Pneumonia      Past Surgical History:  Procedure Laterality Date  . ABDOMINAL HYSTERECTOMY    . CARDIOVASCULAR STRESS TEST  6/16   with contrast---was normal  . CHOLECYSTECTOMY    . COLONOSCOPY WITH PROPOFOL N/A 11/19/2019   Procedure: COLONOSCOPY WITH PROPOFOL;  Surgeon: Midge Minium, MD;  Location: [facility] ENDOSCOPY;  Service: Endoscopy;  Laterality: N/A;  . ESOPHAGOGASTRODUODENOSCOPY (EGD) WITH PROPOFOL N/A 11/19/2019   Procedure: ESOPHAGOGASTRODUODENOSCOPY (EGD) WITH PROPOFOL;  Surgeon: Midge Minium, MD;  Location: [facility] ENDOSCOPY;  Service: Endoscopy;  Laterality: N/A;  . TONSILLECTOMY    . TONSILLECTOMY      Family History  Problem Relation Age of Onset  . Breast cancer Mother   . Colon cancer Mother   . Bipolar disorder Father   . Coronary artery disease Paternal Aunt   . Colon cancer Maternal Grandmother   . Diabetes Neg Hx   . Cancer Neg Hx     Social History   Socioeconomic History  . Marital status: Married    Spouse name: Not on file  . Number of children: 2  . Years of education: Not on file  . Highest education level: Not on file  Occupational History  . Occupation: PA    Employer: [employer]    Comment: rare disease management  Tobacco Use  . Smoking status: Former Smoker    Types: Cigarettes    Quit date: 08/05/1978    Years since quitting: 42.2  .  Smokeless tobacco: Never Used  Vaping Use  . Vaping Use: Never used  Substance and Sexual Activity  . Alcohol use: Not Currently  . Drug use: No  . Sexual activity: Not on file  Other Topics Concern  . Not on file  Social History Narrative  . Not on file   Social Determinants of Health   Financial Resource Strain: Not on file  Food Insecurity: Not on file  Transportation Needs: Not on file  Physical Activity: Not on file  Stress: Not on file  Social Connections: Not on file  Intimate Partner Violence: Not on file   Review of Systems  Constitutional: Positive for unexpected weight change. Negative for fatigue.        Wears seat belt  HENT: Negative for dental problem, hearing loss and tinnitus.        Keeps up with denitst  Eyes: Negative for redness and visual disturbance.       No diplopia or unilateral vision loss  Respiratory: Negative for cough, chest tightness and shortness of breath.   Cardiovascular: Negative for chest pain, palpitations and leg swelling.  Gastrointestinal: Negative for blood in stool and constipation.  Endocrine: Negative for polydipsia and polyuria.  Genitourinary: Negative for dyspareunia, dysuria and urgency.  Musculoskeletal: Negative for back pain and joint swelling.       Typical aches and pains  Skin: Negative for rash.  Allergic/Immunologic: Positive for environmental allergies. Negative for immunocompromised state.       Uses OTC rarely  Neurological: Negative for dizziness, syncope, light-headedness and headaches.  Hematological: Negative for adenopathy. Does not bruise/bleed easily.  Psychiatric/Behavioral: Negative for dysphoric mood and sleep disturbance. The patient is not nervous/anxious.        Objective:   Physical Exam Constitutional:      Appearance: Normal appearance.  HENT:     Right Ear: Tympanic membrane, ear canal and external ear normal.     Left Ear: Tympanic membrane, ear canal and external ear normal.     Mouth/Throat:     Pharynx: No oropharyngeal exudate or posterior oropharyngeal erythema.  Eyes:     Conjunctiva/sclera: Conjunctivae normal.     Pupils: Pupils are equal, round, and reactive to light.  Cardiovascular:     Rate and Rhythm: Normal rate and regular rhythm.     Pulses: Normal pulses.     Heart sounds: No murmur heard. No gallop.   Pulmonary:     Effort: Pulmonary effort is normal.     Breath sounds: Normal breath sounds. No wheezing or rales.  Abdominal:     Palpations: Abdomen is soft.     Tenderness: There is no abdominal tenderness.  Musculoskeletal:     Cervical back: Neck supple.     Right lower leg: No edema.      Left lower leg: No edema.  Lymphadenopathy:     Cervical: No cervical adenopathy.  Skin:    General: Skin is warm.     Findings: No rash.  Neurological:     General: No focal deficit present.     Mental Status: She is alert and oriented to person, place, and time.  Psychiatric:        Mood and Affect: Mood normal.        Behavior: Behavior normal.            Assessment & Plan:

## 2020-10-20 NOTE — Assessment & Plan Note (Signed)
Uses the tramadol in the morning

## 2020-10-20 NOTE — Assessment & Plan Note (Signed)
Healthy Working to get back in shape again Mammogram scheduled for May Colon due again 2031 No pap due to hyster Will probably take shingrix soon COVID booster/flu vaccine in the fall

## 2020-11-05 DIAGNOSIS — S92351A Displaced fracture of fifth metatarsal bone, right foot, initial encounter for closed fracture: Secondary | ICD-10-CM | POA: Diagnosis not present

## 2020-12-13 ENCOUNTER — Other Ambulatory Visit: Payer: Self-pay | Admitting: Internal Medicine

## 2020-12-13 NOTE — Telephone Encounter (Signed)
Last filled 10-16-20 #60 Last OV 10-20-20 Next OV 10-26-21 [facility]

## 2020-12-21 ENCOUNTER — Telehealth: Payer: Self-pay

## 2020-12-21 MED ORDER — FLUCONAZOLE 150 MG PO TABS: 150.0000 mg | ORAL_TABLET | Freq: Once | ORAL | 1 refills | Status: AC

## 2020-12-21 NOTE — Telephone Encounter (Signed)
Spoke to pt. She was very appreciative for the medication and will definitely call and schedule appt if she is not better.

## 2020-12-21 NOTE — Addendum Note (Signed)
Addended by: Tillman Abide I on: 12/21/2020 05:01 PM   Modules accepted: Orders

## 2020-12-21 NOTE — Telephone Encounter (Signed)
Please let her know I did send the fluconazole. Take one and repeat in a few days if not completely gone. If it doesn't go away, then she will need to be checked

## 2020-12-21 NOTE — Telephone Encounter (Signed)
Pt reports vaginal itching and white discharge x 5 days--- pt has tried OTC Monistat no relief... pt wanted to know if you would send her in a Diflucan without an office visit? Please advise

## 2020-12-27 ENCOUNTER — Ambulatory Visit (INDEPENDENT_AMBULATORY_CARE_PROVIDER_SITE_OTHER): Payer: 59 | Admitting: Internal Medicine

## 2020-12-27 ENCOUNTER — Other Ambulatory Visit: Payer: Self-pay

## 2020-12-27 ENCOUNTER — Encounter: Payer: Self-pay | Admitting: Internal Medicine

## 2020-12-27 DIAGNOSIS — B359 Dermatophytosis, unspecified: Secondary | ICD-10-CM | POA: Diagnosis not present

## 2020-12-27 MED ORDER — FLUCONAZOLE 150 MG PO TABS: 150.0000 mg | ORAL_TABLET | ORAL | 3 refills | Status: DC

## 2020-12-27 MED ORDER — SELENIUM SULFIDE 2.5 % EX LOTN: 1.0000 "application " | TOPICAL_LOTION | CUTANEOUS | 12 refills | Status: DC

## 2020-12-27 NOTE — Assessment & Plan Note (Signed)
Findings may indicate topical infection --like tinea versicolor Will try selenium topically 1 week fluconazole for flares

## 2020-12-27 NOTE — Progress Notes (Signed)
Subjective:    Patient ID: Sherri Rice, female    DOB: 1966/01/26, 55 y.o.   MRN: [id number]  HPI Here for trouble with rash This visit occurred during the SARS-CoV-2 public health emergency.  Safety protocols were in place, including screening questions prior to the visit, additional usage of staff PPE, and extensive cleaning of exam room while observing appropriate contact time as indicated for disinfecting solutions.   Started with some itching and rash Then got vaginal symptoms---and she called in  That was better with fluconazole--but still having rash on arms/part on right cheek Seems to worsen with sun exposure or heat Reminds her of past systemic fungal problems Rash all got better with the fluconazole for 2 days--and then worsened again  No change in soap or detergent Same 2 dogs  Current Outpatient Medications on File Prior to Visit  Medication Sig Dispense Refill  . ALPRAZolam (XANAX) 0.25 MG tablet Take 1 tablet (0.25 mg total) by mouth 2 (two) times daily as needed. 30 tablet 0  . estradiol (ESTRACE) 0.5 MG tablet TAKE 1 TABLET BY MOUTH EVERY DAY 90 tablet 3  . fluconazole (DIFLUCAN) 150 MG tablet Take 150 mg by mouth once.    . RABEprazole (ACIPHEX) 20 MG tablet TAKE 1 TABLET BY MOUTH EVERY DAY 90 tablet 1  . sulfamethoxazole-trimethoprim (BACTRIM DS) 800-160 MG tablet TAKE 1 TABLET AFTER INTERCOURSE AS DIRECTED 20 tablet 1  . traMADol (ULTRAM) 50 MG tablet TAKE 1 TABLET BY MOUTH TWICE A DAY AS NEEDED 60 tablet 0   No current facility-administered medications on file prior to visit.    Allergies  Allergen Reactions  . Codeine Hives    REACTION: itching    Past Medical History:  Diagnosis Date  . Degeneration of cervical intervertebral disc   . Frequent PVCs   . Gallstones   . Hyperlipidemia   . Kidney stones   . Mitral valve disorders(424.0)   . Personal history of urinary calculi   . Pneumonia     Past Surgical History:  Procedure Laterality Date  .  ABDOMINAL HYSTERECTOMY    . CARDIOVASCULAR STRESS TEST  6/16   with contrast---was normal  . CHOLECYSTECTOMY    . COLONOSCOPY WITH PROPOFOL N/A 11/19/2019   Procedure: COLONOSCOPY WITH PROPOFOL;  Surgeon: Midge Minium, MD;  Location: [facility] ENDOSCOPY;  Service: Endoscopy;  Laterality: N/A;  . ESOPHAGOGASTRODUODENOSCOPY (EGD) WITH PROPOFOL N/A 11/19/2019   Procedure: ESOPHAGOGASTRODUODENOSCOPY (EGD) WITH PROPOFOL;  Surgeon: Midge Minium, MD;  Location: [facility] ENDOSCOPY;  Service: Endoscopy;  Laterality: N/A;  . TONSILLECTOMY    . TONSILLECTOMY      Family History  Problem Relation Age of Onset  . Breast cancer Mother   . Colon cancer Mother   . Bipolar disorder Father   . Coronary artery disease Paternal Aunt   . Colon cancer Maternal Grandmother   . Diabetes Neg Hx   . Cancer Neg Hx     Social History   Socioeconomic History  . Marital status: Married    Spouse name: Not on file  . Number of children: 2  . Years of education: Not on file  . Highest education level: Not on file  Occupational History  . Occupation: PA    Employer: [employer]    Comment: rare disease management  Tobacco Use  . Smoking status: Former Smoker    Types: Cigarettes    Quit date: 08/05/1978    Years since quitting: 42.4  . Smokeless tobacco: Never Used  Vaping Use  .  Vaping Use: Never used  Substance and Sexual Activity  . Alcohol use: Not Currently  . Drug use: No  . Sexual activity: Not on file  Other Topics Concern  . Not on file  Social History Narrative  . Not on file   Social Determinants of Health   Financial Resource Strain: Not on file  Food Insecurity: Not on file  Transportation Needs: Not on file  Physical Activity: Not on file  Stress: Not on file  Social Connections: Not on file  Intimate Partner Violence: Not on file   Review of Systems  No fever Can have bad itching with rash     Objective:   Physical Exam Skin:    Comments: Does have some diffuse redness on upper  torso-no clear peeling Few small papules            Assessment & Plan:

## 2021-02-12 ENCOUNTER — Other Ambulatory Visit: Payer: Self-pay | Admitting: Internal Medicine

## 2021-02-12 NOTE — Telephone Encounter (Signed)
Last filled 12-13-20-22 #60 Last OV Acute 12-27-20 Next OV 10-26-21 [facility]

## 2021-02-19 ENCOUNTER — Telehealth: Payer: Self-pay

## 2021-02-19 MED ORDER — NIRMATRELVIR/RITONAVIR (PAXLOVID) TABLET (RENAL DOSING): 2.0000 | ORAL_TABLET | Freq: Two times a day (BID) | ORAL | 0 refills | Status: AC

## 2021-02-19 NOTE — Telephone Encounter (Signed)
I spoke to her by phone She sounds okay Slightly labored breathing if going up stairs---otherwise comfortable Biggest issues are fever and headache  Discussed Rx alternatives Will give renal dose paxlovid Discussed that she should not take xanax or tramadol while on this---she expressed understanding She is continuing tylenol  She will seek in person assessment if she worsens

## 2021-02-19 NOTE — Telephone Encounter (Signed)
I spoke with pt; pt tested positive for covid by home test on 02/18/21; symptoms started on 02/17/21; fever today 101.9; on 02/18/21  fever was 102.something;pt taking tylenol 325 mg taking 2 tabs q 4 -6 hrs. Pt is having chills and body aches, prod cough with yellow phlegm, SOB or breathing heavy with exertion; pt vomited on and off for 2 hrs on 02/18/21. No diarrhea and no loss of taste or smell, S/T that does hurt when swallows and runny nose, and has head and sinus congestion. Pt has had severe H/A for 2 days; 5 - 8 for pain level. Pt is presently at [facility].Pt said her husband was called in a covid med and pt does not want to go to UC in [place]. Pt will not be returning home for "awhile". No available appts at [facility].Pt request med be [facility]. Pt said is in no distress. Pt advised to self quarantine, drink plenty of water, rest and continue tylenol for fever. UC & ED precautions given and pt voiced understanding. Pt request cb after review from Dr Alphonsus Sias. Sending note to Dr Alphonsus Sias and Carollee Herter CMA. Also taking note to Dr Karle Starch area.

## 2021-02-19 NOTE — Telephone Encounter (Signed)
[facility] Night - Client TELEPHONE ADVICE RECORD [employer] Patient Name: Sherri Rice Gender: Female DOB: 1965-08-20 Age: 55 Y 1 M 16 D Return Phone Number: [phone] (Primary), [phone] (Secondary) Address[place] [address] Client [facility] Night - Client Client Site [facility] - Night Physician Tillman Abide- MD Contact Type Call Who Is Calling Patient / Member / Family / Caregiver Call Type Triage / Clinical Relationship To Patient Self Return Phone Number [phone] (Primary) Chief Complaint Fever (non urgent symptom) (> THREE MONTHS) Reason for Call Symptomatic / Request for Health Information Initial Comment Caller states she tested positive for COVID. She states she has a fever of 101-102.0. She states she has congestion and body aches. Translation No Nurse Assessment Nurse: Valentina Lucks, RN, Morrie Sheldon Date/Time (Eastern Time): 02/18/2021 3:01:36 PM Confirm and document reason for call. If symptomatic, describe symptoms. ---Caller states she tested positive for Covid this morning. Symptoms started yesterday. Running a fever of 102 (oral), body aches, congestion, coughing up phelgm, vomited last night, having bouts of incontinence. Does the patient have any new or worsening symptoms? ---Yes Will a triage be completed? ---Yes Related visit to physician within the last 2 weeks? ---No Does the PT have any chronic conditions? (i.e. diabetes, asthma, this includes High risk factors for pregnancy, etc.) ---Yes List chronic conditions. ---mitral valve prolapse Is this a behavioral health or substance abuse call? ---No Guidelines Guideline Title Affirmed Question Affirmed Notes Nurse Date/Time (Eastern Time) COVID-19 - Diagnosed or Suspected [1] COVID-19 infection suspected by caller or triager AND [2] mild symptoms (cough, fever, or others) Valentina Lucks, Producer, television/film/video  02/18/2021 3:05:14 PM PLEASE NOTE: All timestamps contained within this report are represented as [place] Standard Time. CONFIDENTIALTY NOTICE: This fax transmission is intended only for the addressee. It contains information that is legally privileged, confidential or otherwise protected from use or disclosure. If you are not the intended recipient, you are strictly prohibited from reviewing, disclosing, copying using or disseminating any of this information or taking any action in reliance on or regarding this information. If you have received this fax in error, please notify us immediately by telephone so that we can arrange for its return to [place]. Phone: [phone], Toll-Free: [phone], Fax: [phone] Page: 2 of 2 Call Id: [id number] Guidelines Guideline Title Affirmed Question Affirmed Notes Nurse Date/Time Lamount Cohen Time) AND [3] negative COVID-19 rapid test Disp. Time Lamount Cohen Time) Disposition Final User 02/18/2021 3:16:00 PM Call PCP when Office is Open Yes Valentina Lucks, RN, Merlinda Frederick Disagree/Comply Comply Caller Understands Yes PreDisposition Call Doctor Care Advice Given Per Guideline CALL PCP WHEN OFFICE IS OPEN: * You need to discuss this with your doctor (or NP/PA) within the next few days. * Call the office when it is open. * The symptoms are generally treated the same whether you have COVID-19, influenza or some other respiratory virus. * Cough: Use cough drops. * Feeling dehydrated: Drink extra liquids. If the air in your home is dry, use a humidifier. * Fever: For fever over 101 F (38.3 C), take acetaminophen every 4 to 6 hours (Adults 650 mg) OR ibuprofen every 6 to 8 hours (Adults 400 mg). Before taking any medicine, read all the instructions on the package. Do not take aspirin unless your doctor has prescribed it for you. COUGH SYRUP WITH DEXTROMETHORPHAN - EXTRA NOTES AND WARNINGS: HUMIDIFIER: * If the air is dry, use a humidifier in the bedroom. * Dry air makes  coughs worse. * Drink warm fluids. Inhale warm mist. This can help relax the airway and also loosen up phlegm. COUGHING SPELLS: * Suck on cough drops or hard candy to coat the irritated throat. CALL BACK IF: * You become worse * Fever returns after being gone for 24 hours * Chest pain or difficulty breathing occurs * Fever lasts over 3 days * Fever over 103 F (39.4 C) CARE ADVICE given per COVID-19 - DIAGNOSED OR SUSPECTED (Adult) guideline.

## 2021-02-21 ENCOUNTER — Ambulatory Visit: Payer: 59 | Admitting: Internal Medicine

## 2021-04-11 ENCOUNTER — Other Ambulatory Visit: Payer: Self-pay | Admitting: Internal Medicine

## 2021-04-12 NOTE — Telephone Encounter (Signed)
Tramadol last filled 02-12-21 #60 Alprazolam last filled 10-20-20 #30 Last OV Acute 12-27-20 Next OV/CPE 10-26-21 [facility]

## 2021-06-18 ENCOUNTER — Other Ambulatory Visit: Payer: Self-pay | Admitting: Internal Medicine

## 2021-06-18 NOTE — Telephone Encounter (Signed)
Last OV - 12/27/2020 Next OV - 10/26/2021 Last Filled - 04/12/2021 Tramadol 50 mg 60 tablets with 0 refills

## 2021-08-21 ENCOUNTER — Other Ambulatory Visit: Payer: Self-pay | Admitting: Internal Medicine

## 2021-08-21 NOTE — Telephone Encounter (Signed)
Last filled 06-18-21 #60 Last OV 10-20-20 Next OV 10-26-21 [facility]

## 2021-08-22 NOTE — Telephone Encounter (Signed)
Alprazolam last filled 04-12-21 #30 LAst OV 12-27-20 Next OV 10-26-21 [facility]

## 2021-10-23 ENCOUNTER — Other Ambulatory Visit: Payer: Self-pay | Admitting: Internal Medicine

## 2021-10-23 NOTE — Telephone Encounter (Signed)
Alprazolam last filled 08-22-21 #30 ?Last OV 12-27-20 ?Next OV 10-26-21 [facility] ?

## 2021-10-25 ENCOUNTER — Other Ambulatory Visit: Payer: Self-pay | Admitting: Internal Medicine

## 2021-10-26 ENCOUNTER — Ambulatory Visit (INDEPENDENT_AMBULATORY_CARE_PROVIDER_SITE_OTHER): Payer: 59 | Admitting: Internal Medicine

## 2021-10-26 ENCOUNTER — Other Ambulatory Visit: Payer: Self-pay

## 2021-10-26 ENCOUNTER — Encounter: Payer: Self-pay | Admitting: Internal Medicine

## 2021-10-26 VITALS — BP 140/82 | HR 74 | Temp 97.9°F | Ht 64.0 in | Wt 198.0 lb

## 2021-10-26 DIAGNOSIS — N951 Menopausal and female climacteric states: Secondary | ICD-10-CM | POA: Diagnosis not present

## 2021-10-26 DIAGNOSIS — E785 Hyperlipidemia, unspecified: Secondary | ICD-10-CM

## 2021-10-26 DIAGNOSIS — K2100 Gastro-esophageal reflux disease with esophagitis, without bleeding: Secondary | ICD-10-CM

## 2021-10-26 DIAGNOSIS — Z0000 Encounter for general adult medical examination without abnormal findings: Secondary | ICD-10-CM

## 2021-10-26 DIAGNOSIS — M5030 Other cervical disc degeneration, unspecified cervical region: Secondary | ICD-10-CM

## 2021-10-26 LAB — LIPID PANEL
Cholesterol: 211 mg/dL — ABNORMAL HIGH (ref 0–200)
HDL: 57.2 mg/dL (ref >39.00)
LDL Cholesterol: 139 mg/dL — ABNORMAL HIGH (ref 0–99)
NonHDL: 154.03
Total CHOL/HDL Ratio: 4
Triglycerides: 77 mg/dL (ref 0.0–149.0)
VLDL: 15.4 mg/dL (ref 0.0–40.0)

## 2021-10-26 LAB — RENAL FUNCTION PANEL
Albumin: 4.5 g/dL (ref 3.5–5.2)
BUN: 12 mg/dL (ref 6–23)
CO2: 29 mEq/L (ref 19–32)
Calcium: 9.4 mg/dL (ref 8.4–10.5)
Chloride: 104 mEq/L (ref 96–112)
Creatinine, Ser: 1.01 mg/dL (ref 0.40–1.20)
GFR: 61.66 mL/min (ref >60.00)
Glucose, Bld: 77 mg/dL (ref 70–99)
Phosphorus: 3.5 mg/dL (ref 2.3–4.6)
Potassium: 4.1 mEq/L (ref 3.5–5.1)
Sodium: 140 mEq/L (ref 135–145)

## 2021-10-26 LAB — CBC
HCT: 42.2 % (ref 36.0–46.0)
Hemoglobin: 14 g/dL (ref 12.0–15.0)
MCHC: 33.2 g/dL (ref 30.0–36.0)
MCV: 89.4 fl (ref 78.0–100.0)
Platelets: 284 10*3/uL (ref 150.0–400.0)
RBC: 4.72 Mil/uL (ref 3.87–5.11)
RDW: 14.1 % (ref 11.5–15.5)
WBC: 8.1 10*3/uL (ref 4.0–10.5)

## 2021-10-26 LAB — HEPATIC FUNCTION PANEL
ALT: 30 U/L (ref 0–35)
AST: 36 U/L (ref 0–37)
Albumin: 4.5 g/dL (ref 3.5–5.2)
Alkaline Phosphatase: 139 U/L — ABNORMAL HIGH (ref 39–117)
Bilirubin, Direct: 0.2 mg/dL (ref 0.0–0.3)
Total Bilirubin: 0.8 mg/dL (ref 0.2–1.2)
Total Protein: 7.1 g/dL (ref 6.0–8.3)

## 2021-10-26 MED ORDER — ESTRADIOL 0.5 MG PO TABS
0.5000 mg | ORAL_TABLET | Freq: Every day | ORAL | 3 refills | Status: DC
Start: 2021-10-26 — End: 2021-10-26

## 2021-10-26 MED ORDER — ESTRADIOL 0.5 MG PO TABS: 0.5000 mg | ORAL_TABLET | ORAL | 3 refills | Status: DC

## 2021-10-26 NOTE — Progress Notes (Signed)
? ?Subjective:  ? ? Patient ID: Sherri Rice, female    DOB: 07/11/1966, 56 y.o.   MRN: [id number] ? ?HPI ?Here for physical ? ?Had been doing well---then last couple of weeks, wasn't feeling well ?Tired--?from the pollen ?Had spell of dizziness, weird vision, BP up (with wrist cuff) ?Some facial flushing ?Calmed down and it did seem better ? ?Stopped the estrogen about 2 months ago ?Discussed--this is the likely cause ? ?Did stop all junk food---2.5 months ago ?Hasn't lost weight though ?Still watching carbs ?Limited dairy---?lactose sensitive ? ?Still uses 1 tramadol daily --for her neck ? ?GFR last year was 54 ? ?Current Outpatient Medications on File Prior to Visit  ?Medication Sig Dispense Refill  ? ALPRAZolam (XANAX) 0.25 MG tablet TAKE 1 TABLET BY MOUTH TWICE A DAY AS NEEDED 30 tablet 0  ? estradiol (ESTRACE) 0.5 MG tablet TAKE 1 TABLET BY MOUTH EVERY DAY 90 tablet 3  ? fluconazole (DIFLUCAN) 150 MG tablet Take 1 tablet (150 mg total) by mouth every other day. 4 tablet 3  ? sulfamethoxazole-trimethoprim (BACTRIM DS) 800-160 MG tablet TAKE 1 TABLET AFTER INTERCOURSE AS DIRECTED 20 tablet 1  ? traMADol (ULTRAM) 50 MG tablet TAKE 1 TABLET BY MOUTH TWICE A DAY AS NEEDED 60 tablet 0  ? ?No current facility-administered medications on file prior to visit.  ? ? ?Allergies  ?Allergen Reactions  ? Codeine Hives  ?  REACTION: itching  ? ? ?Past Medical History:  ?Diagnosis Date  ? Degeneration of cervical intervertebral disc   ? Frequent PVCs   ? Gallstones   ? Hyperlipidemia   ? Kidney stones   ? Mitral valve disorders(424.0)   ? Personal history of urinary calculi   ? Pneumonia   ? ? ?Past Surgical History:  ?Procedure Laterality Date  ? ABDOMINAL HYSTERECTOMY    ? CARDIOVASCULAR STRESS TEST  6/16  ? with contrast---was normal  ? CHOLECYSTECTOMY    ? COLONOSCOPY WITH PROPOFOL N/A 11/19/2019  ? Procedure: COLONOSCOPY WITH PROPOFOL;  Surgeon: Midge Minium, MD;  Location: [facility] ENDOSCOPY;  Service: Endoscopy;  Laterality:  N/A;  ? ESOPHAGOGASTRODUODENOSCOPY (EGD) WITH PROPOFOL N/A 11/19/2019  ? Procedure: ESOPHAGOGASTRODUODENOSCOPY (EGD) WITH PROPOFOL;  Surgeon: Midge Minium, MD;  Location: [facility] ENDOSCOPY;  Service: Endoscopy;  Laterality: N/A;  ? TONSILLECTOMY    ? TONSILLECTOMY    ? ? ?Family History  ?Problem Relation Age of Onset  ? Breast cancer Mother   ? Colon cancer Mother   ? Bipolar disorder Father   ? Coronary artery disease Paternal Aunt   ? Colon cancer Maternal Grandmother   ? Diabetes Neg Hx   ? Cancer Neg Hx   ? ? ?Social History  ? ?Socioeconomic History  ? Marital status: Married  ?  Spouse name: Not on file  ? Number of children: 2  ? Years of education: Not on file  ? Highest education level: Not on file  ?Occupational History  ? Occupation: PA  ?  Employer: [employer]  ?  Comment: rare disease management  ?Tobacco Use  ? Smoking status: Former  ?  Types: Cigarettes  ?  Quit date: 08/05/1978  ?  Years since quitting: 43.2  ? Smokeless tobacco: Never  ?Vaping Use  ? Vaping Use: Never used  ?Substance and Sexual Activity  ? Alcohol use: Not Currently  ? Drug use: No  ? Sexual activity: Not on file  ?Other Topics Concern  ? Not on file  ?Social History Narrative  ? Not on file  ? ?  Social Determinants of Health  ? ?Financial Resource Strain: Not on file  ?Food Insecurity: Not on file  ?Transportation Needs: Not on file  ?Physical Activity: Not on file  ?Stress: Not on file  ?Social Connections: Not on file  ?Intimate Partner Violence: Not on file  ? ?Review of Systems  ?Constitutional:  Negative for fatigue and unexpected weight change.  ?     Wears seat belt ?Walks regularly---with dogs  ?HENT:  Positive for tinnitus. Negative for dental problem and hearing loss.   ?     Keeps up with dentist  ?Eyes:  Negative for visual disturbance.  ?     No diplopia or unilateral vision loss  ?Respiratory:  Negative for cough, chest tightness and shortness of breath.   ?Cardiovascular:  Negative for chest pain and palpitations.  ?     Some  leg swelling with these recent spells  ?Gastrointestinal:  Negative for blood in stool and constipation.  ?     Occasional heartburn--off the aciphex   ?Endocrine: Positive for heat intolerance. Negative for polydipsia and polyuria.  ?Genitourinary:  Negative for dyspareunia, dysuria and hematuria.  ?     Still uses bactrim for post coital prophylaxis  ?Musculoskeletal:  Negative for arthralgias, back pain and joint swelling.  ?     Minor aches and pain  ?Skin:  Negative for rash.  ?Allergic/Immunologic: Positive for environmental allergies. Negative for immunocompromised state.  ?     No meds  ?Neurological:  Positive for dizziness and headaches. Negative for syncope and light-headedness.  ?Hematological:  Negative for adenopathy. Does not bruise/bleed easily.  ?Psychiatric/Behavioral:  Negative for dysphoric mood and sleep disturbance. The patient is not nervous/anxious.   ? ?   ?Objective:  ? Physical Exam ?Constitutional:   ?   Appearance: Normal appearance.  ?HENT:  ?   Mouth/Throat:  ?   Pharynx: No oropharyngeal exudate or posterior oropharyngeal erythema.  ?Eyes:  ?   Conjunctiva/sclera: Conjunctivae normal.  ?   Pupils: Pupils are equal, round, and reactive to light.  ?Cardiovascular:  ?   Rate and Rhythm: Normal rate and regular rhythm.  ?   Pulses: Normal pulses.  ?   Heart sounds: No murmur heard. ?  No gallop.  ?Pulmonary:  ?   Effort: Pulmonary effort is normal.  ?   Breath sounds: Normal breath sounds. No wheezing or rales.  ?Abdominal:  ?   Palpations: Abdomen is soft.  ?   Tenderness: There is no abdominal tenderness.  ?Musculoskeletal:  ?   Cervical back: Neck supple.  ?   Right lower leg: No edema.  ?   Left lower leg: No edema.  ?Lymphadenopathy:  ?   Cervical: No cervical adenopathy.  ?Skin: ?   Findings: No lesion or rash.  ?Neurological:  ?   General: No focal deficit present.  ?   Mental Status: She is alert and oriented to person, place, and time.  ?Psychiatric:     ?   Mood and Affect: Mood  normal.     ?   Behavior: Behavior normal.  ?  ? ? ? ? ?   ?Assessment & Plan:  ? ?

## 2021-10-26 NOTE — Assessment & Plan Note (Signed)
Not excited about primary prevention ?

## 2021-10-26 NOTE — Assessment & Plan Note (Signed)
Uses the tramadol daily ?

## 2021-10-26 NOTE — Assessment & Plan Note (Signed)
Doing okay off the aciphex ?Discussed using prn PPI ?

## 2021-10-26 NOTE — Assessment & Plan Note (Addendum)
Recent symptoms sound like this--after stopping estrogen a couple of months ago ?Feels better now but will go back on every other day (estradiol) ?  ?

## 2021-10-26 NOTE — Assessment & Plan Note (Signed)
Healthy ?Working on exercise/proper eating ?Colon due 2031 ?Gets yearly mammograms---will try to get me reports ?No pap due to hyster ?Will consider shingrix ?Flu vaccine in fall and possibly COVID ?

## 2021-12-17 ENCOUNTER — Other Ambulatory Visit: Payer: Self-pay | Admitting: Internal Medicine

## 2021-12-17 NOTE — Telephone Encounter (Signed)
Last filled 10-25-21 #60 ?Last OV 10-26-21 ?Next OV 10-28-22 ?[facility] ?

## 2022-02-20 ENCOUNTER — Other Ambulatory Visit: Payer: Self-pay | Admitting: Internal Medicine

## 2022-02-20 NOTE — Telephone Encounter (Signed)
Last filled 12-17-21 #60 Last OV 10-26-21 Next OV 10-28-22 [facility]

## 2022-04-22 ENCOUNTER — Other Ambulatory Visit: Payer: Self-pay | Admitting: Internal Medicine

## 2022-04-22 NOTE — Telephone Encounter (Signed)
Last filled 02/20/22 #60 Last OV 10-26-21 Next OV 10-28-22 [facility]

## 2022-05-07 ENCOUNTER — Other Ambulatory Visit: Payer: Self-pay | Admitting: Internal Medicine

## 2022-05-07 NOTE — Telephone Encounter (Signed)
Alprazolam last filled 08-22-21 #30 Last OV 10-26-21 Next OV 10-28-22 [facility]

## 2022-05-20 ENCOUNTER — Other Ambulatory Visit: Payer: Self-pay | Admitting: Internal Medicine

## 2022-06-20 ENCOUNTER — Other Ambulatory Visit: Payer: Self-pay | Admitting: Internal Medicine

## 2022-06-20 NOTE — Telephone Encounter (Signed)
Last filled 04/22/22 #60 Last OV 10-26-21 Next OV 10-28-22 [facility]

## 2022-08-20 ENCOUNTER — Other Ambulatory Visit: Payer: Self-pay | Admitting: Internal Medicine

## 2022-08-20 NOTE — Telephone Encounter (Signed)
Last filled 06/20/22 #60 Last OV 10-26-21 Next OV 10-28-22 [facility]

## 2022-09-12 ENCOUNTER — Other Ambulatory Visit: Payer: Self-pay | Admitting: Internal Medicine

## 2022-10-22 ENCOUNTER — Other Ambulatory Visit: Payer: Self-pay | Admitting: Internal Medicine

## 2022-10-22 NOTE — Telephone Encounter (Signed)
Last filled 08-20-22 #60 Last OV 10-26-21 Next OV 10-28-22 [facility]

## 2022-10-28 ENCOUNTER — Encounter: Payer: 59 | Admitting: Internal Medicine

## 2022-11-06 ENCOUNTER — Other Ambulatory Visit: Payer: Self-pay | Admitting: Internal Medicine

## 2022-12-19 ENCOUNTER — Other Ambulatory Visit: Payer: Self-pay | Admitting: Internal Medicine

## 2022-12-23 NOTE — Telephone Encounter (Signed)
Last filled 10-23-22 #60 Last OV 10-26-21 Next OV 01-14-23 [facility]

## 2023-01-14 ENCOUNTER — Ambulatory Visit (INDEPENDENT_AMBULATORY_CARE_PROVIDER_SITE_OTHER): Payer: 59 | Admitting: Internal Medicine

## 2023-01-14 ENCOUNTER — Other Ambulatory Visit: Payer: Self-pay | Admitting: Internal Medicine

## 2023-01-14 ENCOUNTER — Encounter: Payer: Self-pay | Admitting: Internal Medicine

## 2023-01-14 VITALS — BP 110/70 | HR 68 | Temp 97.8°F | Ht 64.25 in | Wt 187.0 lb

## 2023-01-14 DIAGNOSIS — M5030 Other cervical disc degeneration, unspecified cervical region: Secondary | ICD-10-CM | POA: Diagnosis not present

## 2023-01-14 DIAGNOSIS — Z0000 Encounter for general adult medical examination without abnormal findings: Secondary | ICD-10-CM | POA: Diagnosis not present

## 2023-01-14 DIAGNOSIS — N951 Menopausal and female climacteric states: Secondary | ICD-10-CM | POA: Diagnosis not present

## 2023-01-14 DIAGNOSIS — K2100 Gastro-esophageal reflux disease with esophagitis, without bleeding: Secondary | ICD-10-CM | POA: Diagnosis not present

## 2023-01-14 DIAGNOSIS — E785 Hyperlipidemia, unspecified: Secondary | ICD-10-CM | POA: Diagnosis not present

## 2023-01-14 DIAGNOSIS — Z1231 Encounter for screening mammogram for malignant neoplasm of breast: Secondary | ICD-10-CM

## 2023-01-14 LAB — CBC
HCT: 46.5 % — ABNORMAL HIGH (ref 36.0–46.0)
Hemoglobin: 14.9 g/dL (ref 12.0–15.0)
MCHC: 32.1 g/dL (ref 30.0–36.0)
MCV: 90.6 fl (ref 78.0–100.0)
Platelets: 320 10*3/uL (ref 150.0–400.0)
RBC: 5.13 Mil/uL — ABNORMAL HIGH (ref 3.87–5.11)
RDW: 14.4 % (ref 11.5–15.5)
WBC: 6.2 10*3/uL (ref 4.0–10.5)

## 2023-01-14 LAB — COMPREHENSIVE METABOLIC PANEL
ALT: 20 U/L (ref 0–35)
AST: 30 U/L (ref 0–37)
Albumin: 4.3 g/dL (ref 3.5–5.2)
Alkaline Phosphatase: 111 U/L (ref 39–117)
BUN: 10 mg/dL (ref 6–23)
CO2: 26 mEq/L (ref 19–32)
Calcium: 9.4 mg/dL (ref 8.4–10.5)
Chloride: 103 mEq/L (ref 96–112)
Creatinine, Ser: 1.24 mg/dL — ABNORMAL HIGH (ref 0.40–1.20)
GFR: 47.8 mL/min — ABNORMAL LOW (ref >60.00)
Glucose, Bld: 82 mg/dL (ref 70–99)
Potassium: 4.7 mEq/L (ref 3.5–5.1)
Sodium: 135 mEq/L (ref 135–145)
Total Bilirubin: 0.6 mg/dL (ref 0.2–1.2)
Total Protein: 7.4 g/dL (ref 6.0–8.3)

## 2023-01-14 LAB — LIPID PANEL
Cholesterol: 226 mg/dL — ABNORMAL HIGH (ref 0–200)
HDL: 59.7 mg/dL (ref >39.00)
LDL Cholesterol: 146 mg/dL — ABNORMAL HIGH (ref 0–99)
NonHDL: 166.77
Total CHOL/HDL Ratio: 4
Triglycerides: 105 mg/dL (ref 0.0–149.0)
VLDL: 21 mg/dL (ref 0.0–40.0)

## 2023-01-14 MED ORDER — ESTRADIOL 0.5 MG PO TABS: 0.5000 mg | ORAL_TABLET | ORAL | 0 refills | Status: DC

## 2023-01-14 MED ORDER — ALPRAZOLAM 0.25 MG PO TABS: 0.2500 mg | ORAL_TABLET | Freq: Two times a day (BID) | ORAL | 0 refills | Status: DC | PRN

## 2023-01-14 NOTE — Progress Notes (Signed)
Subjective:    Patient ID: Sherri Rice, female    DOB: [birth year], 57 y.o.   MRN: [id number]  HPI Here for physical  Overdue for mammogram--had been getting at [facility] Will get one local now---at our mobile Still using the estrogen---now every other day  Still uses the tramadol daily for her neck Uses the xanax periodically  Current Outpatient Medications on File Prior to Visit  Medication Sig Dispense Refill   sulfamethoxazole-trimethoprim (BACTRIM DS) 800-160 MG tablet TAKE 1 TABLET AFTER INTERCOURSE AS DIRECTED 20 tablet 1   traMADol (ULTRAM) 50 MG tablet TAKE 1 TABLET BY MOUTH TWICE A DAY AS NEEDED 60 tablet 0   No current facility-administered medications on file prior to visit.    Allergies  Allergen Reactions   Codeine Hives    REACTION: itching    Past Medical History:  Diagnosis Date   Degeneration of cervical intervertebral disc    Frequent PVCs    Gallstones    Hyperlipidemia    Kidney stones    Mitral valve disorders(424.0)    Personal history of urinary calculi    Pneumonia     Past Surgical History:  Procedure Laterality Date   ABDOMINAL HYSTERECTOMY     CARDIOVASCULAR STRESS TEST  6/16   with contrast---was normal   CHOLECYSTECTOMY     COLONOSCOPY WITH PROPOFOL N/A 11/19/2019   Procedure: COLONOSCOPY WITH PROPOFOL;  Surgeon: Midge Minium, MD;  Location: [facility] ENDOSCOPY;  Service: Endoscopy;  Laterality: N/A;   ESOPHAGOGASTRODUODENOSCOPY (EGD) WITH PROPOFOL N/A 11/19/2019   Procedure: ESOPHAGOGASTRODUODENOSCOPY (EGD) WITH PROPOFOL;  Surgeon: Midge Minium, MD;  Location: [facility] ENDOSCOPY;  Service: Endoscopy;  Laterality: N/A;   TONSILLECTOMY     TONSILLECTOMY      Family History  Problem Relation Age of Onset   Breast cancer Mother    Colon cancer Mother    Bipolar disorder Father    Coronary artery disease Paternal Aunt    Colon cancer Maternal Grandmother    Diabetes Neg Hx    Cancer Neg Hx     Social History   Socioeconomic History    Marital status: Married    Spouse name: Not on file   Number of children: 2   Years of education: Not on file   Highest education level: Not on file  Occupational History   Occupation: PA    Employer: [employer]    Comment: rare disease management  Tobacco Use   Smoking status: Former    Types: Cigarettes    Quit date: 08/05/1978    Years since quitting: 44.4   Smokeless tobacco: Never  Vaping Use   Vaping Use: Never used  Substance and Sexual Activity   Alcohol use: Not Currently   Drug use: No   Sexual activity: Not on file  Other Topics Concern   Not on file  Social History Narrative   Not on file   Social Determinants of Health   Financial Resource Strain: Not on file  Food Insecurity: Not on file  Transportation Needs: Not on file  Physical Activity: Not on file  Stress: Not on file  Social Connections: Not on file  Intimate Partner Violence: Not on file   Review of Systems  Constitutional:  Negative for fatigue.       Working on lifestyle---has lost 11# this year Did go to weight loss clinic at [employer]--briefly on wegovy Wears seat belt  HENT:  Negative for hearing loss, tinnitus and trouble swallowing.  Has broken tooth--needs crown  Eyes:  Negative for redness and visual disturbance.  Respiratory:  Negative for cough, chest tightness and shortness of breath.   Cardiovascular:  Negative for chest pain, palpitations and leg swelling.  Gastrointestinal:  Negative for blood in stool and constipation.       No heartburn  Endocrine: Negative for polydipsia and polyuria.  Genitourinary:  Negative for dyspareunia, dysuria and hematuria.  Musculoskeletal:  Negative for arthralgias, back pain and joint swelling.  Skin:  Negative for rash.  Allergic/Immunologic: Positive for environmental allergies. Negative for immunocompromised state.       Mild--no meds  Neurological:  Negative for dizziness, syncope, light-headedness and headaches.  Hematological:  Negative for  adenopathy. Does not bruise/bleed easily.  Psychiatric/Behavioral:  Negative for dysphoric mood and sleep disturbance. The patient is not nervous/anxious.        Objective:   Physical Exam Constitutional:      Appearance: Normal appearance.  HENT:     Mouth/Throat:     Pharynx: No oropharyngeal exudate or posterior oropharyngeal erythema.  Eyes:     Conjunctiva/sclera: Conjunctivae normal.     Pupils: Pupils are equal, round, and reactive to light.  Cardiovascular:     Rate and Rhythm: Normal rate and regular rhythm.     Pulses: Normal pulses.     Heart sounds: No murmur heard.    No gallop.  Pulmonary:     Effort: Pulmonary effort is normal.     Breath sounds: Normal breath sounds. No wheezing or rales.  Abdominal:     Palpations: Abdomen is soft.     Tenderness: There is no abdominal tenderness.  Musculoskeletal:     Cervical back: Neck supple.     Right lower leg: No edema.     Left lower leg: No edema.  Lymphadenopathy:     Cervical: No cervical adenopathy.  Skin:    Findings: No rash.  Neurological:     General: No focal deficit present.     Mental Status: She is alert and oriented to person, place, and time.  Psychiatric:        Mood and Affect: Mood normal.        Behavior: Behavior normal.            Assessment & Plan:

## 2023-01-14 NOTE — Assessment & Plan Note (Signed)
Symptoms controlled with estradiol 0.5mg  every other day

## 2023-01-14 NOTE — Assessment & Plan Note (Signed)
Use the tramadol daily

## 2023-01-14 NOTE — Assessment & Plan Note (Signed)
Mild  Will recheck 

## 2023-01-14 NOTE — Assessment & Plan Note (Signed)
Improved with lifestyle No meds now

## 2023-01-14 NOTE — Assessment & Plan Note (Signed)
Healthy Good work on lifestyle Prefers no shingrix Will need COVID and flu vaccines in the fall again

## 2023-01-21 ENCOUNTER — Telehealth: Payer: Self-pay

## 2023-01-21 NOTE — Telephone Encounter (Signed)
-----   Message from Karie Schwalbe, MD sent at 01/15/2023  1:27 PM EDT ----- Results released Find out if she takes over the counter NSAIDs If not, find out if she wants to see a kidney specialist

## 2023-01-21 NOTE — Telephone Encounter (Signed)
Left message to call office

## 2023-01-24 NOTE — Telephone Encounter (Signed)
2nd message left for pt.

## 2023-01-31 ENCOUNTER — Ambulatory Visit
Admission: RE | Admit: 2023-01-31 | Discharge: 2023-01-31 | Disposition: A | Discharge status: Home / Self Care | Payer: 59 | Source: Ambulatory Visit | Attending: Internal Medicine | Admitting: Internal Medicine

## 2023-01-31 DIAGNOSIS — Z1231 Encounter for screening mammogram for malignant neoplasm of breast: Secondary | ICD-10-CM | POA: Diagnosis not present

## 2023-02-05 NOTE — Telephone Encounter (Signed)
3rd message left on verified VM for pt.

## 2023-02-05 NOTE — Telephone Encounter (Signed)
Left message on VM per DPR to call the office and schedule repeat lab. Please put future orders in. Thanks.

## 2023-02-05 NOTE — Telephone Encounter (Signed)
Patient states she doesn't take NSAIDs Give a month or so and then run test again, thinks could be a fluke because for a while she was taking pretty signficant amounts of benedryl (a couple of times daily), 8 diet cokes daily Has made changes, drinking water  So would like to wait and see if numbers get better by her managing her drinking and medicine habits  Please call the patient at [phone] if need further information

## 2023-03-04 ENCOUNTER — Other Ambulatory Visit: Payer: Self-pay | Admitting: Internal Medicine

## 2023-03-04 NOTE — Telephone Encounter (Signed)
Last filled 12-23-22 #60 Last OV 01-14-23 Next OV 01-15-24 [facility]

## 2023-03-11 ENCOUNTER — Ambulatory Visit: Payer: 59 | Admitting: Internal Medicine

## 2023-04-28 ENCOUNTER — Other Ambulatory Visit: Payer: Self-pay | Admitting: Internal Medicine

## 2023-04-28 NOTE — Telephone Encounter (Signed)
Last filled 03-04-23 #60 Last OV 01-14-23 Next OV 01-15-24 [facility]

## 2023-06-10 ENCOUNTER — Telehealth: Payer: Self-pay

## 2023-06-10 MED ORDER — SULFAMETHOXAZOLE-TRIMETHOPRIM 800-160 MG PO TABS: ORAL_TABLET | ORAL | 0 refills | Status: DC

## 2023-06-10 NOTE — Telephone Encounter (Signed)
Rx sent electronically.  

## 2023-06-19 ENCOUNTER — Other Ambulatory Visit: Payer: Self-pay | Admitting: Internal Medicine

## 2023-06-19 NOTE — Telephone Encounter (Signed)
Last filled 04-28-23 #60 Last OV 01-14-23 Next OV 01-15-24 [facility]

## 2023-07-24 ENCOUNTER — Other Ambulatory Visit: Payer: Self-pay | Admitting: Internal Medicine

## 2023-07-24 NOTE — Telephone Encounter (Signed)
Last filled 01-14-23 #30 Last OV 01-14-23 Next OV 01-15-24 [facility]

## 2023-08-19 ENCOUNTER — Encounter: Payer: Self-pay | Admitting: Internal Medicine

## 2023-08-19 MED ORDER — TRAMADOL HCL 50 MG PO TABS: 50.0000 mg | ORAL_TABLET | Freq: Two times a day (BID) | ORAL | 0 refills | Status: DC | PRN

## 2023-08-19 NOTE — Telephone Encounter (Signed)
 Refill request for Tramadol 50 mg tabs  LOV - 01/14/23 Next OV - 01/15/24 Last refill - 06/19/23 #60/0

## 2023-08-29 ENCOUNTER — Other Ambulatory Visit: Payer: Self-pay | Admitting: Internal Medicine

## 2023-09-02 DIAGNOSIS — Z6831 Body mass index (BMI) 31.0-31.9, adult: Secondary | ICD-10-CM | POA: Diagnosis not present

## 2023-09-02 DIAGNOSIS — E66811 Obesity, class 1: Secondary | ICD-10-CM | POA: Diagnosis not present

## 2023-09-02 DIAGNOSIS — E6609 Other obesity due to excess calories: Secondary | ICD-10-CM | POA: Diagnosis not present

## 2023-10-03 ENCOUNTER — Other Ambulatory Visit: Payer: Self-pay | Admitting: Internal Medicine

## 2023-10-03 NOTE — Telephone Encounter (Signed)
 Last filled 08-19-23 #60 Last OV 01-14-23 Next OV 01-15-24 [facility]

## 2023-12-11 ENCOUNTER — Other Ambulatory Visit: Payer: Self-pay | Admitting: Internal Medicine

## 2023-12-11 NOTE — Telephone Encounter (Signed)
 Last filled 10-03-23 #60 Last OV 01-14-23 Next OV 01-15-24 [facility]

## 2024-01-12 ENCOUNTER — Other Ambulatory Visit: Payer: Self-pay | Admitting: Internal Medicine

## 2024-01-12 DIAGNOSIS — Z1231 Encounter for screening mammogram for malignant neoplasm of breast: Secondary | ICD-10-CM

## 2024-01-15 ENCOUNTER — Encounter: Payer: 59 | Admitting: Internal Medicine

## 2024-01-19 ENCOUNTER — Encounter: Payer: Self-pay | Admitting: Internal Medicine

## 2024-01-19 ENCOUNTER — Ambulatory Visit (INDEPENDENT_AMBULATORY_CARE_PROVIDER_SITE_OTHER): Admitting: Internal Medicine

## 2024-01-19 VITALS — BP 110/80 | HR 76 | Temp 97.9°F | Ht 64.0 in | Wt 191.0 lb

## 2024-01-19 DIAGNOSIS — N951 Menopausal and female climacteric states: Secondary | ICD-10-CM | POA: Diagnosis not present

## 2024-01-19 DIAGNOSIS — N390 Urinary tract infection, site not specified: Secondary | ICD-10-CM | POA: Diagnosis not present

## 2024-01-19 DIAGNOSIS — Z0000 Encounter for general adult medical examination without abnormal findings: Secondary | ICD-10-CM

## 2024-01-19 MED ORDER — SULFAMETHOXAZOLE-TRIMETHOPRIM 800-160 MG PO TABS: ORAL_TABLET | ORAL | 1 refills | Status: DC

## 2024-01-19 MED ORDER — ALPRAZOLAM 0.25 MG PO TABS: 0.2500 mg | ORAL_TABLET | Freq: Two times a day (BID) | ORAL | 0 refills | Status: AC | PRN

## 2024-01-19 NOTE — Assessment & Plan Note (Signed)
 Does well with septra  post coital

## 2024-01-19 NOTE — Assessment & Plan Note (Signed)
 Doing well with every other day estrogen 0.5mg   Discussed lowest effective dose

## 2024-01-19 NOTE — Progress Notes (Signed)
 Subjective:    Patient ID: Sherri Rice, female    DOB: August 27, 1965, 58 y.o.   MRN: [id number]  HPI Here for physical  Did look into weight loss medication via [facility] Tried one dose of zepbound---but didn't want to continue Has lost 20# or so since maximum Has improved eating since last year Walking more --with dog. Does stair master also  Continues to use the tramadol  twice a day Mostly for neck pain--but other joints also Rarely uses the xanax   Current Outpatient Medications on File Prior to Visit  Medication Sig Dispense Refill   ALPRAZolam  (XANAX ) 0.25 MG tablet TAKE 1 TABLET BY MOUTH 2 TIMES DAILY AS NEEDED. 30 tablet 0   estradiol  (ESTRACE ) 0.5 MG tablet TAKE 1 TABLET BY MOUTH EVERY DAY 90 tablet 0   sulfamethoxazole -trimethoprim  (BACTRIM  DS) 800-160 MG tablet TAKE 1 TABLET AFTER INTERCOURSE AS DIRECTED 20 tablet 1   traMADol  (ULTRAM ) 50 MG tablet Take 1 tablet (50 mg total) by mouth 2 (two) times daily as needed. 60 tablet 0   No current facility-administered medications on file prior to visit.    Allergies  Allergen Reactions   Codeine Hives    REACTION: itching    Past Medical History:  Diagnosis Date   Degeneration of cervical intervertebral disc    Frequent PVCs    Gallstones    Hyperlipidemia    Kidney stones    Mitral valve disorders(424.0)    Personal history of urinary calculi    Pneumonia     Past Surgical History:  Procedure Laterality Date   ABDOMINAL HYSTERECTOMY     CARDIOVASCULAR STRESS TEST  6/16   with contrast---was normal   CHOLECYSTECTOMY     COLONOSCOPY WITH PROPOFOL  N/A 11/19/2019   Procedure: COLONOSCOPY WITH PROPOFOL ;  Surgeon: Marnee Sink, MD;  Location: [facility] ENDOSCOPY;  Service: Endoscopy;  Laterality: N/A;   ESOPHAGOGASTRODUODENOSCOPY (EGD) WITH PROPOFOL  N/A 11/19/2019   Procedure: ESOPHAGOGASTRODUODENOSCOPY (EGD) WITH PROPOFOL ;  Surgeon: Marnee Sink, MD;  Location: [facility] ENDOSCOPY;  Service: Endoscopy;  Laterality: N/A;    TONSILLECTOMY     TONSILLECTOMY      Family History  Problem Relation Age of Onset   Breast cancer Mother    Colon cancer Mother    Bipolar disorder Father    Coronary artery disease Paternal Aunt    Colon cancer Maternal Grandmother    Diabetes Neg Hx    Cancer Neg Hx     Social History   Socioeconomic History   Marital status: Married    Spouse name: Not on file   Number of children: 2   Years of education: Not on file   Highest education level: Not on file  Occupational History   Occupation: PA    Employer: [employer]    Comment: rare disease management  Tobacco Use   Smoking status: Former    Current packs/day: 0.00    Types: Cigarettes    Quit date: 08/05/1978    Years since quitting: 45.4   Smokeless tobacco: Never  Vaping Use   Vaping status: Never Used  Substance and Sexual Activity   Alcohol use: Not Currently   Drug use: No   Sexual activity: Not on file  Other Topics Concern   Not on file  Social History Narrative   Not on file   Social Drivers of Health   Financial Resource Strain: Not on file  Food Insecurity: Not on file  Transportation Needs: Not on file  Physical Activity: Not on file  Stress: Not on file  Social Connections: Not on file  Intimate Partner Violence: Not on file   Review of Systems  Constitutional:  Negative for fatigue.       Wears seat belt   HENT:  Negative for hearing loss and trouble swallowing.        Chronic left ear tinnitus Broken tooth--will need crown  Eyes:  Negative for visual disturbance.       No diplopia or unilateral vision loss  Respiratory:  Negative for cough, chest tightness and shortness of breath.   Cardiovascular:  Negative for chest pain, palpitations and leg swelling.  Gastrointestinal:  Negative for blood in stool and constipation.       Some heartburn--tums helps  Endocrine: Negative for polydipsia and polyuria.  Genitourinary:  Negative for dyspareunia, dysuria and hematuria.       Estrogen every  other day--didn't tolerate further wean Uses the post coital antibiotic  Musculoskeletal:  Positive for arthralgias and neck pain. Negative for joint swelling.  Skin:  Negative for rash.       No suspicious skin lesions  Allergic/Immunologic: Positive for environmental allergies. Negative for immunocompromised state.       No meds needed  Neurological:  Negative for dizziness, syncope, light-headedness and headaches.  Hematological:  Does not bruise/bleed easily.  Psychiatric/Behavioral:  Positive for decreased concentration. Negative for dysphoric mood and sleep disturbance. The patient is nervous/anxious.        Mostly uses xanax  for sleep if stress (with parents with Alzheimer's etc)       Objective:   Physical Exam Constitutional:      Appearance: Normal appearance.  HENT:     Mouth/Throat:     Pharynx: No oropharyngeal exudate or posterior oropharyngeal erythema.   Eyes:     Conjunctiva/sclera: Conjunctivae normal.     Pupils: Pupils are equal, round, and reactive to light.    Cardiovascular:     Rate and Rhythm: Normal rate and regular rhythm.     Pulses: Normal pulses.     Heart sounds: No murmur heard.    No gallop.  Pulmonary:     Effort: Pulmonary effort is normal.     Breath sounds: Normal breath sounds. No wheezing or rales.  Abdominal:     Palpations: Abdomen is soft.     Tenderness: There is no abdominal tenderness.   Musculoskeletal:     Cervical back: Neck supple.     Right lower leg: No edema.     Left lower leg: No edema.  Lymphadenopathy:     Cervical: No cervical adenopathy.   Skin:    Findings: No rash.   Neurological:     General: No focal deficit present.     Mental Status: She is alert and oriented to person, place, and time.   Psychiatric:        Mood and Affect: Mood normal.        Behavior: Behavior normal.            Assessment & Plan:

## 2024-01-19 NOTE — Assessment & Plan Note (Signed)
 Healthy Working on lifestyle Flu/COVID updates in the fall Colon due 2031 Having mammogram soon No pap due to hyster  Recent labs at [facility]--she will send results

## 2024-02-09 ENCOUNTER — Other Ambulatory Visit: Payer: Self-pay | Admitting: Internal Medicine

## 2024-03-05 ENCOUNTER — Ambulatory Visit
Admission: RE | Admit: 2024-03-05 | Discharge: 2024-03-05 | Disposition: A | Discharge status: Home / Self Care | Source: Ambulatory Visit | Attending: Internal Medicine | Admitting: Internal Medicine

## 2024-03-05 DIAGNOSIS — Z1231 Encounter for screening mammogram for malignant neoplasm of breast: Secondary | ICD-10-CM

## 2024-03-10 ENCOUNTER — Ambulatory Visit: Payer: Self-pay | Admitting: Internal Medicine

## 2024-03-10 ENCOUNTER — Other Ambulatory Visit: Payer: Self-pay | Admitting: Internal Medicine

## 2024-03-10 DIAGNOSIS — R928 Other abnormal and inconclusive findings on diagnostic imaging of breast: Secondary | ICD-10-CM

## 2024-03-17 ENCOUNTER — Encounter

## 2024-03-17 ENCOUNTER — Other Ambulatory Visit

## 2024-03-18 ENCOUNTER — Ambulatory Visit
Admission: RE | Admit: 2024-03-18 | Discharge: 2024-03-18 | Disposition: A | Discharge status: Home / Self Care | Source: Ambulatory Visit | Attending: Internal Medicine | Admitting: Internal Medicine

## 2024-03-18 DIAGNOSIS — R928 Other abnormal and inconclusive findings on diagnostic imaging of breast: Secondary | ICD-10-CM

## 2024-03-19 ENCOUNTER — Ambulatory Visit: Payer: Self-pay | Admitting: Internal Medicine

## 2024-03-23 ENCOUNTER — Other Ambulatory Visit: Payer: Self-pay | Admitting: Internal Medicine

## 2024-03-23 NOTE — Telephone Encounter (Signed)
 Last OV: 01/19/24 Pending OV: 02/07/25 - Dr. Bennett Medication: Tramadol  50mg  Directions: Take one tablet BID prn Last Refill: 02/10/24 Qty: #60 with 0 refills

## 2024-04-21 ENCOUNTER — Other Ambulatory Visit: Payer: Self-pay

## 2024-04-21 MED ORDER — ESTRADIOL 0.5 MG PO TABS: 0.5000 mg | ORAL_TABLET | Freq: Every day | ORAL | 2 refills | Status: AC

## 2024-04-21 NOTE — Telephone Encounter (Signed)
 Rx sent electronically.

## 2024-06-08 ENCOUNTER — Telehealth: Payer: Self-pay

## 2024-06-08 NOTE — Telephone Encounter (Signed)
 Copied from CRM #[id number]. Topic: Clinical - Medication Question >> Jun 08, 2024  3:18 PM Victoria A wrote: Reason for CRM: Patient is inquiring about refill on estradiol  (ESTRACE ) 0.5 MG tablet; She has TOC appt on 02/07/25-please contact patient

## 2024-06-09 MED ORDER — SULFAMETHOXAZOLE-TRIMETHOPRIM 800-160 MG PO TABS: ORAL_TABLET | ORAL | 1 refills | Status: AC

## 2024-06-09 NOTE — Telephone Encounter (Signed)
 Called and spoke to pt. Made her a sooner TOC appt with Dr Bennett. Kept the July appt as her CPE

## 2024-08-10 ENCOUNTER — Ambulatory Visit (INDEPENDENT_AMBULATORY_CARE_PROVIDER_SITE_OTHER)

## 2024-08-10 VITALS — BP 110/80 | HR 79 | Temp 97.7°F | Ht 64.0 in | Wt 196.0 lb

## 2024-08-10 DIAGNOSIS — Z1339 Encounter for screening examination for other mental health and behavioral disorders: Secondary | ICD-10-CM

## 2024-08-10 DIAGNOSIS — N951 Menopausal and female climacteric states: Secondary | ICD-10-CM | POA: Diagnosis not present

## 2024-08-10 DIAGNOSIS — N390 Urinary tract infection, site not specified: Secondary | ICD-10-CM

## 2024-08-10 DIAGNOSIS — M5030 Other cervical disc degeneration, unspecified cervical region: Secondary | ICD-10-CM | POA: Diagnosis not present

## 2024-08-10 DIAGNOSIS — F5101 Primary insomnia: Secondary | ICD-10-CM | POA: Diagnosis not present

## 2024-08-10 DIAGNOSIS — F419 Anxiety disorder, unspecified: Secondary | ICD-10-CM | POA: Diagnosis not present

## 2024-08-10 MED ORDER — TRAMADOL HCL 50 MG PO TABS: 50.0000 mg | ORAL_TABLET | Freq: Two times a day (BID) | ORAL | 0 refills | Status: AC | PRN

## 2024-08-10 MED ORDER — DOXEPIN HCL 3 MG PO TABS: 3.0000 mg | ORAL_TABLET | Freq: Every evening | ORAL | 0 refills | Status: AC

## 2024-08-10 NOTE — Progress Notes (Signed)
 "  Subjective:   This visit was conducted in person. The patient gave informed consent to the use of Abridge AI technology to record the contents of the encounter as documented below.   Patient ID: Sherri Rice, female    DOB: 03-11-1966, 59 y.o.   MRN: [id number]   Discussed the use of AI scribe software for clinical note transcription with the patient, who gave verbal consent to proceed.  History of Present Illness Sherri Rice is a 59 year old female who presents for medication management and follow-up.  She has cervical disc disease, which causes neck pain and numbness in her pinky finger. She uses tramadol , one tablet daily, primarily in the morning to manage pain and improve neck mobility. Occasionally, she takes a second dose if needed. She has been practicing chair yoga to improve upper body strength and manage symptoms.  She experiences anxiety, which she manages with alprazolam  as needed, primarily for sleep disturbances related to stress. She uses it infrequently, with the last refill in June. She has tried melatonin in the past but found it ineffective and causing grogginess.  She has a history of mitral valve prolapse diagnosed in her twenties, which is asymptomatic and does not require prophylactic antibiotics. She also has a history of acid reflux, managed with SFX as needed, and actinic keratosis.  She underwent surgical menopause at age 55 due to uterine prolapse and adhesions, resulting in a hysterectomy and bilateral oophorectomy. She has been on Estrace  since then, currently at 0.5 mg daily, to manage menopausal symptoms. Attempts to reduce the dose led to hair and eyelash loss, prompting a return to the current dose.  She has a history of high cholesterol, kidney stones last documented in 2010, and recurrent urinary tract infections managed with Bactrim  as needed post-intercourse. No recent kidney stones and no stomach issues with Bactrim  use.  Her family history includes a  mother who had breast cancer at age 22. She had a recent benign cyst confirmed by ultrasound after a mammogram change.      Review of Systems  All other systems reviewed and are negative.       Allergies[1]  Medications Ordered Prior to Encounter[2]  BP 110/80   Pulse 79   Temp 97.7 F (36.5 C) (Oral)   Ht 5' 4 (1.626 m)   Wt 196 lb (88.9 kg)   SpO2 94%   BMI 33.64 kg/m   Objective:      Physical Exam GENERAL: Alert, cooperative, well developed, no acute distress. HEAD: Normocephalic atraumatic. EYES: Extraocular movements intact BL, pupils round, equal and reactive to light BL, conjunctivae normal BL. EXTREMITIES: No cyanosis or edema. NEUROLOGICAL: Oriented to person, place and time, no gait abnormalities, moves all extremities without gross motor or sensory deficit.         Assessment & Plan:   Assessment & Plan Primary insomnia Chronic insomnia managed with alprazolam . Concerns about interaction with tramadol .  Extensively counseled patient that insomnia management with benzodiazepines inappropriate, counseled about risks including cardiorespiratory suppression, dependence and tolerance.  Counseled that Xanax  will not be continued for this reason, discussed alternative treatment with doxepin , counseled about potential side effects, she is agreeable to try. - Started doxepin  3 mg at bedtime. - Instructed to monitor for sedation and drowsiness. - Follow up in two weeks to assess response.   Anxiety Managed with alprazolam  previously, counseled about risks of benzodiazepines, for this reason we will be discontinuing.  No daily antianxiety therapy at this time per  patient preference, however, if symptoms recur, would recommend as needed Atarax.   Degeneration of cervical intervertebral disc with chronic pain Chronic neck pain managed with tramadol . Discussed monitoring due to potential interactions. - Continue tramadol  as needed at current dose - Obtained  urine sample for monitoring. - Updated controlled substance agreement. - Controlled substance use agreement signed and updated   Surgical menopause with hormone replacement therapy History of total hysterectomy in her 30s secondary to severe prolapse and adhesions, managed with Estrace  0.5 mg daily. Previous dose reduction led to hair and eyelash loss. Discussed potential future reduction considering family history of breast cancer.  - Continue Estrace  0.5 mg daily. - Consider future non-hormonal interventions such as clonidine.   General health maintenance Up to date on flu shot and COVID booster. Discussed importance of regular exams and monitoring controlled substance use. - Continue routine health maintenance and screenings. - Plan annual physical exam in June 2026.   Return in about 2 weeks (around 08/24/2024).   Adin Laker K Sofhia Ulibarri, MD  08/10/2024     Contains text generated by Abridge.        [1]  Allergies Allergen Reactions   Codeine Hives    REACTION: itching  [2]  Current Outpatient Medications on File Prior to Visit  Medication Sig Dispense Refill   ALPRAZolam  (XANAX ) 0.25 MG tablet Take 1 tablet (0.25 mg total) by mouth 2 (two) times daily as needed for anxiety. 30 tablet 0   estradiol  (ESTRACE ) 0.5 MG tablet Take 1 tablet (0.5 mg total) by mouth daily. 90 tablet 2   sulfamethoxazole -trimethoprim  (BACTRIM  DS) 800-160 MG tablet TAKE 1 TABLET AFTER INTERCOURSE AS DIRECTED 20 tablet 1   No current facility-administered medications on file prior to visit.   "

## 2024-08-10 NOTE — Patient Instructions (Addendum)
 Thank you for visiting [facility] today! Here's what we talked about: - STOP Xanax , start doxepin  - Make follow up appointment once you have been on the Doxepin  at least 2 weeks

## 2024-08-11 ENCOUNTER — Other Ambulatory Visit (HOSPITAL_COMMUNITY): Payer: Self-pay

## 2024-08-11 ENCOUNTER — Telehealth: Payer: Self-pay

## 2024-08-11 NOTE — Telephone Encounter (Signed)
 Pharmacy Patient Advocate Encounter  Received notification from [facility] that Prior Authorization for Tramadol  50 has been APPROVED from 08/11/24 to 02/07/25. Unable to run test claim because patient plan is not contracted with [facility] pharmacies.   PA #/Case ID/Reference #: # [id number]

## 2024-08-11 NOTE — Telephone Encounter (Signed)
 Pharmacy Patient Advocate Encounter   Received notification from [facility] that prior authorization for Tramadol  50 tabs is required/requested.   Insurance verification completed.   The patient is insured through [facility]   Per test claim: PA required; PA submitted to above mentioned insurance via Latent Key/confirmation #/EOC [id number] Status is pending

## 2024-08-13 LAB — DRUG MONITORING, PANEL 8 WITH CONFIRMATION, URINE
6 Acetylmorphine: NEGATIVE ng/mL
Alcohol Metabolites: NEGATIVE ng/mL
Alphahydroxyalprazolam: NEGATIVE ng/mL
Alphahydroxymidazolam: NEGATIVE ng/mL
Alphahydroxytriazolam: NEGATIVE ng/mL
Aminoclonazepam: NEGATIVE ng/mL
Amphetamines: NEGATIVE ng/mL
Benzodiazepines: NEGATIVE ng/mL
Buprenorphine, Urine: NEGATIVE ng/mL
Cocaine Metabolite: NEGATIVE ng/mL
Creatinine: 110.2 mg/dL
Hydroxyethylflurazepam: NEGATIVE ng/mL
Lorazepam: NEGATIVE ng/mL
MDMA: NEGATIVE ng/mL
Marijuana Metabolite: NEGATIVE ng/mL
Nordiazepam: NEGATIVE ng/mL
Opiates: NEGATIVE ng/mL
Oxazepam: NEGATIVE ng/mL
Oxidant: NEGATIVE ug/mL
Oxycodone: NEGATIVE ng/mL
Temazepam: NEGATIVE ng/mL
pH: 5.5 (ref 4.5–9.0)

## 2024-08-13 LAB — DM TEMPLATE

## 2024-08-17 ENCOUNTER — Ambulatory Visit: Payer: Self-pay

## 2025-02-07 ENCOUNTER — Encounter

## 2025-02-15 ENCOUNTER — Encounter
